# Patient Record
Sex: Female | Born: 1940
Health system: Southern US, Community
[De-identification: ages and names within clinical notes are randomized; demographics above are authoritative.]

## PROBLEM LIST (undated history)

## (undated) DIAGNOSIS — M2550 Pain in unspecified joint: Secondary | ICD-10-CM

## (undated) DIAGNOSIS — K5900 Constipation, unspecified: Secondary | ICD-10-CM

## (undated) DIAGNOSIS — M5136 Other intervertebral disc degeneration, lumbar region: Secondary | ICD-10-CM

## (undated) DIAGNOSIS — Z86718 Personal history of other venous thrombosis and embolism: Secondary | ICD-10-CM

## (undated) DIAGNOSIS — G894 Chronic pain syndrome: Secondary | ICD-10-CM

## (undated) DIAGNOSIS — M329 Systemic lupus erythematosus, unspecified: Secondary | ICD-10-CM

## (undated) DIAGNOSIS — M069 Rheumatoid arthritis, unspecified: Secondary | ICD-10-CM

## (undated) DIAGNOSIS — E079 Disorder of thyroid, unspecified: Secondary | ICD-10-CM

## (undated) DIAGNOSIS — M1990 Unspecified osteoarthritis, unspecified site: Secondary | ICD-10-CM

## (undated) DIAGNOSIS — R0602 Shortness of breath: Secondary | ICD-10-CM

## (undated) DIAGNOSIS — J449 Chronic obstructive pulmonary disease, unspecified: Secondary | ICD-10-CM

## (undated) DIAGNOSIS — E559 Vitamin D deficiency, unspecified: Secondary | ICD-10-CM

## (undated) DIAGNOSIS — N390 Urinary tract infection, site not specified: Secondary | ICD-10-CM

## (undated) DIAGNOSIS — K219 Gastro-esophageal reflux disease without esophagitis: Secondary | ICD-10-CM

## (undated) DIAGNOSIS — M797 Fibromyalgia: Secondary | ICD-10-CM

## (undated) DIAGNOSIS — IMO0002 Reserved for concepts with insufficient information to code with codable children: Secondary | ICD-10-CM

## (undated) DIAGNOSIS — E785 Hyperlipidemia, unspecified: Secondary | ICD-10-CM

## (undated) DIAGNOSIS — M51369 Other intervertebral disc degeneration, lumbar region without mention of lumbar back pain or lower extremity pain: Secondary | ICD-10-CM

## (undated) DIAGNOSIS — E1140 Type 2 diabetes mellitus with diabetic neuropathy, unspecified: Secondary | ICD-10-CM

## (undated) DIAGNOSIS — I10 Essential (primary) hypertension: Secondary | ICD-10-CM

## (undated) HISTORY — DX: Pain in unspecified joint: M25.50

## (undated) HISTORY — DX: Other intervertebral disc degeneration, lumbar region without mention of lumbar back pain or lower extremity pain: M51.369

## (undated) HISTORY — PX: REPLACEMENT TOTAL KNEE: SUR1224

## (undated) HISTORY — PX: OTHER SURGICAL HISTORY: SHX169

## (undated) HISTORY — DX: Fibromyalgia: M79.7

## (undated) HISTORY — DX: Disorder of thyroid, unspecified: E07.9

## (undated) HISTORY — DX: Shortness of breath: R06.02

## (undated) HISTORY — DX: Type 2 diabetes mellitus with diabetic neuropathy, unspecified: E11.40

## (undated) HISTORY — DX: Chronic obstructive pulmonary disease, unspecified: J44.9

## (undated) HISTORY — DX: Chronic pain syndrome: G89.4

## (undated) HISTORY — DX: Gastro-esophageal reflux disease without esophagitis: K21.9

## (undated) HISTORY — DX: Rheumatoid arthritis, unspecified: M06.9

## (undated) HISTORY — DX: Unspecified osteoarthritis, unspecified site: M19.90

## (undated) HISTORY — DX: Vitamin D deficiency, unspecified: E55.9

## (undated) HISTORY — PX: ABDOMINAL HYSTERECTOMY: SHX81

## (undated) HISTORY — DX: Essential (primary) hypertension: I10

## (undated) HISTORY — DX: Reserved for concepts with insufficient information to code with codable children: IMO0002

## (undated) HISTORY — DX: Hyperlipidemia, unspecified: E78.5

## (undated) HISTORY — DX: Systemic lupus erythematosus, unspecified: M32.9

## (undated) HISTORY — PX: PARTIAL HYSTERECTOMY: SHX80

## (undated) HISTORY — PX: BACK SURGERY: SHX140

## (undated) HISTORY — DX: Personal history of other venous thrombosis and embolism: Z86.718

## (undated) HISTORY — DX: Constipation, unspecified: K59.00

## (undated) HISTORY — DX: Urinary tract infection, site not specified: N39.0

## (undated) HISTORY — DX: Other intervertebral disc degeneration, lumbar region: M51.36

---

## 2016-09-27 DIAGNOSIS — J449 Chronic obstructive pulmonary disease, unspecified: Secondary | ICD-10-CM | POA: Diagnosis not present

## 2016-09-27 DIAGNOSIS — M329 Systemic lupus erythematosus, unspecified: Secondary | ICD-10-CM | POA: Diagnosis not present

## 2016-09-27 DIAGNOSIS — I10 Essential (primary) hypertension: Secondary | ICD-10-CM | POA: Diagnosis not present

## 2016-09-27 DIAGNOSIS — E1142 Type 2 diabetes mellitus with diabetic polyneuropathy: Secondary | ICD-10-CM | POA: Diagnosis not present

## 2016-09-27 DIAGNOSIS — M797 Fibromyalgia: Secondary | ICD-10-CM | POA: Diagnosis not present

## 2016-09-27 DIAGNOSIS — E039 Hypothyroidism, unspecified: Secondary | ICD-10-CM | POA: Diagnosis not present

## 2016-09-27 DIAGNOSIS — E785 Hyperlipidemia, unspecified: Secondary | ICD-10-CM | POA: Diagnosis not present

## 2016-09-27 DIAGNOSIS — M810 Age-related osteoporosis without current pathological fracture: Secondary | ICD-10-CM | POA: Diagnosis not present

## 2016-10-12 DIAGNOSIS — Z87440 Personal history of urinary (tract) infections: Secondary | ICD-10-CM | POA: Diagnosis not present

## 2016-10-12 DIAGNOSIS — M797 Fibromyalgia: Secondary | ICD-10-CM | POA: Diagnosis not present

## 2016-10-12 DIAGNOSIS — M329 Systemic lupus erythematosus, unspecified: Secondary | ICD-10-CM | POA: Diagnosis not present

## 2016-10-12 DIAGNOSIS — N390 Urinary tract infection, site not specified: Secondary | ICD-10-CM | POA: Diagnosis not present

## 2016-10-12 DIAGNOSIS — M25473 Effusion, unspecified ankle: Secondary | ICD-10-CM | POA: Diagnosis not present

## 2016-10-12 DIAGNOSIS — E1142 Type 2 diabetes mellitus with diabetic polyneuropathy: Secondary | ICD-10-CM | POA: Diagnosis not present

## 2016-11-05 DIAGNOSIS — W19XXXA Unspecified fall, initial encounter: Secondary | ICD-10-CM | POA: Diagnosis not present

## 2016-11-05 DIAGNOSIS — I10 Essential (primary) hypertension: Secondary | ICD-10-CM | POA: Diagnosis not present

## 2016-11-05 DIAGNOSIS — T148XXA Other injury of unspecified body region, initial encounter: Secondary | ICD-10-CM | POA: Diagnosis not present

## 2016-11-05 DIAGNOSIS — M25511 Pain in right shoulder: Secondary | ICD-10-CM | POA: Diagnosis not present

## 2016-11-05 DIAGNOSIS — S42031A Displaced fracture of lateral end of right clavicle, initial encounter for closed fracture: Secondary | ICD-10-CM | POA: Diagnosis not present

## 2016-11-05 DIAGNOSIS — M898X1 Other specified disorders of bone, shoulder: Secondary | ICD-10-CM | POA: Diagnosis not present

## 2016-11-05 DIAGNOSIS — S42001A Fracture of unspecified part of right clavicle, initial encounter for closed fracture: Secondary | ICD-10-CM | POA: Diagnosis not present

## 2016-11-06 DIAGNOSIS — E1140 Type 2 diabetes mellitus with diabetic neuropathy, unspecified: Secondary | ICD-10-CM | POA: Diagnosis not present

## 2016-11-06 DIAGNOSIS — G894 Chronic pain syndrome: Secondary | ICD-10-CM | POA: Diagnosis not present

## 2016-11-06 DIAGNOSIS — M797 Fibromyalgia: Secondary | ICD-10-CM | POA: Diagnosis not present

## 2016-11-06 DIAGNOSIS — E039 Hypothyroidism, unspecified: Secondary | ICD-10-CM | POA: Diagnosis not present

## 2016-11-06 DIAGNOSIS — J449 Chronic obstructive pulmonary disease, unspecified: Secondary | ICD-10-CM | POA: Diagnosis not present

## 2016-11-06 DIAGNOSIS — F329 Major depressive disorder, single episode, unspecified: Secondary | ICD-10-CM | POA: Diagnosis not present

## 2016-11-06 DIAGNOSIS — I10 Essential (primary) hypertension: Secondary | ICD-10-CM | POA: Diagnosis not present

## 2016-11-06 DIAGNOSIS — M329 Systemic lupus erythematosus, unspecified: Secondary | ICD-10-CM | POA: Diagnosis not present

## 2016-11-06 DIAGNOSIS — W19XXXD Unspecified fall, subsequent encounter: Secondary | ICD-10-CM | POA: Diagnosis not present

## 2016-11-06 DIAGNOSIS — M810 Age-related osteoporosis without current pathological fracture: Secondary | ICD-10-CM | POA: Diagnosis not present

## 2016-11-06 DIAGNOSIS — M519 Unspecified thoracic, thoracolumbar and lumbosacral intervertebral disc disorder: Secondary | ICD-10-CM | POA: Diagnosis not present

## 2016-11-06 DIAGNOSIS — Z9181 History of falling: Secondary | ICD-10-CM | POA: Diagnosis not present

## 2016-11-06 DIAGNOSIS — M25511 Pain in right shoulder: Secondary | ICD-10-CM | POA: Diagnosis not present

## 2016-11-06 DIAGNOSIS — S1083XD Contusion of other specified part of neck, subsequent encounter: Secondary | ICD-10-CM | POA: Diagnosis not present

## 2016-11-26 DIAGNOSIS — S42011D Anterior displaced fracture of sternal end of right clavicle, subsequent encounter for fracture with routine healing: Secondary | ICD-10-CM | POA: Diagnosis not present

## 2016-11-30 DIAGNOSIS — M150 Primary generalized (osteo)arthritis: Secondary | ICD-10-CM | POA: Diagnosis not present

## 2016-11-30 DIAGNOSIS — Z6830 Body mass index (BMI) 30.0-30.9, adult: Secondary | ICD-10-CM | POA: Diagnosis not present

## 2016-11-30 DIAGNOSIS — M797 Fibromyalgia: Secondary | ICD-10-CM | POA: Diagnosis not present

## 2016-11-30 DIAGNOSIS — E669 Obesity, unspecified: Secondary | ICD-10-CM | POA: Diagnosis not present

## 2016-11-30 DIAGNOSIS — M329 Systemic lupus erythematosus, unspecified: Secondary | ICD-10-CM | POA: Diagnosis not present

## 2016-11-30 DIAGNOSIS — M7989 Other specified soft tissue disorders: Secondary | ICD-10-CM | POA: Diagnosis not present

## 2016-11-30 DIAGNOSIS — M5136 Other intervertebral disc degeneration, lumbar region: Secondary | ICD-10-CM | POA: Diagnosis not present

## 2016-12-13 DIAGNOSIS — M542 Cervicalgia: Secondary | ICD-10-CM | POA: Diagnosis not present

## 2016-12-13 DIAGNOSIS — E039 Hypothyroidism, unspecified: Secondary | ICD-10-CM | POA: Diagnosis not present

## 2016-12-13 DIAGNOSIS — M549 Dorsalgia, unspecified: Secondary | ICD-10-CM | POA: Diagnosis not present

## 2016-12-13 DIAGNOSIS — I10 Essential (primary) hypertension: Secondary | ICD-10-CM | POA: Diagnosis not present

## 2016-12-13 DIAGNOSIS — M329 Systemic lupus erythematosus, unspecified: Secondary | ICD-10-CM | POA: Diagnosis not present

## 2016-12-13 DIAGNOSIS — J449 Chronic obstructive pulmonary disease, unspecified: Secondary | ICD-10-CM | POA: Diagnosis not present

## 2016-12-13 DIAGNOSIS — M797 Fibromyalgia: Secondary | ICD-10-CM | POA: Diagnosis not present

## 2016-12-13 DIAGNOSIS — E1142 Type 2 diabetes mellitus with diabetic polyneuropathy: Secondary | ICD-10-CM | POA: Diagnosis not present

## 2016-12-13 DIAGNOSIS — E785 Hyperlipidemia, unspecified: Secondary | ICD-10-CM | POA: Diagnosis not present

## 2016-12-22 DIAGNOSIS — M5136 Other intervertebral disc degeneration, lumbar region: Secondary | ICD-10-CM | POA: Diagnosis not present

## 2016-12-22 DIAGNOSIS — I700 Atherosclerosis of aorta: Secondary | ICD-10-CM | POA: Diagnosis not present

## 2016-12-22 DIAGNOSIS — M542 Cervicalgia: Secondary | ICD-10-CM | POA: Diagnosis not present

## 2016-12-22 DIAGNOSIS — M549 Dorsalgia, unspecified: Secondary | ICD-10-CM | POA: Diagnosis not present

## 2016-12-22 DIAGNOSIS — S22050A Wedge compression fracture of T5-T6 vertebra, initial encounter for closed fracture: Secondary | ICD-10-CM | POA: Diagnosis not present

## 2016-12-22 DIAGNOSIS — M4854XA Collapsed vertebra, not elsewhere classified, thoracic region, initial encounter for fracture: Secondary | ICD-10-CM | POA: Diagnosis not present

## 2017-01-18 DIAGNOSIS — L578 Other skin changes due to chronic exposure to nonionizing radiation: Secondary | ICD-10-CM | POA: Diagnosis not present

## 2017-01-18 DIAGNOSIS — L821 Other seborrheic keratosis: Secondary | ICD-10-CM | POA: Diagnosis not present

## 2017-01-18 DIAGNOSIS — Z85820 Personal history of malignant melanoma of skin: Secondary | ICD-10-CM | POA: Diagnosis not present

## 2017-01-18 DIAGNOSIS — L570 Actinic keratosis: Secondary | ICD-10-CM | POA: Diagnosis not present

## 2017-01-18 DIAGNOSIS — D485 Neoplasm of uncertain behavior of skin: Secondary | ICD-10-CM | POA: Diagnosis not present

## 2017-02-15 DIAGNOSIS — R937 Abnormal findings on diagnostic imaging of other parts of musculoskeletal system: Secondary | ICD-10-CM | POA: Diagnosis not present

## 2017-02-15 DIAGNOSIS — M4802 Spinal stenosis, cervical region: Secondary | ICD-10-CM | POA: Diagnosis not present

## 2017-02-15 DIAGNOSIS — M5030 Other cervical disc degeneration, unspecified cervical region: Secondary | ICD-10-CM | POA: Diagnosis not present

## 2017-02-15 DIAGNOSIS — M542 Cervicalgia: Secondary | ICD-10-CM | POA: Diagnosis not present

## 2017-03-01 DIAGNOSIS — M47812 Spondylosis without myelopathy or radiculopathy, cervical region: Secondary | ICD-10-CM | POA: Diagnosis not present

## 2017-03-22 DIAGNOSIS — I10 Essential (primary) hypertension: Secondary | ICD-10-CM | POA: Diagnosis not present

## 2017-03-22 DIAGNOSIS — Z79899 Other long term (current) drug therapy: Secondary | ICD-10-CM | POA: Diagnosis not present

## 2017-03-22 DIAGNOSIS — E1142 Type 2 diabetes mellitus with diabetic polyneuropathy: Secondary | ICD-10-CM | POA: Diagnosis not present

## 2017-03-22 DIAGNOSIS — E039 Hypothyroidism, unspecified: Secondary | ICD-10-CM | POA: Diagnosis not present

## 2017-03-22 DIAGNOSIS — E559 Vitamin D deficiency, unspecified: Secondary | ICD-10-CM | POA: Diagnosis not present

## 2017-03-22 DIAGNOSIS — M47812 Spondylosis without myelopathy or radiculopathy, cervical region: Secondary | ICD-10-CM | POA: Diagnosis not present

## 2017-03-22 DIAGNOSIS — E785 Hyperlipidemia, unspecified: Secondary | ICD-10-CM | POA: Diagnosis not present

## 2017-03-30 DIAGNOSIS — M4802 Spinal stenosis, cervical region: Secondary | ICD-10-CM | POA: Diagnosis not present

## 2017-05-05 DIAGNOSIS — M545 Low back pain: Secondary | ICD-10-CM | POA: Diagnosis not present

## 2017-05-05 DIAGNOSIS — S3992XA Unspecified injury of lower back, initial encounter: Secondary | ICD-10-CM | POA: Diagnosis not present

## 2017-05-05 DIAGNOSIS — W57XXXA Bitten or stung by nonvenomous insect and other nonvenomous arthropods, initial encounter: Secondary | ICD-10-CM | POA: Diagnosis not present

## 2017-05-24 DIAGNOSIS — M5442 Lumbago with sciatica, left side: Secondary | ICD-10-CM | POA: Diagnosis not present

## 2017-05-24 DIAGNOSIS — M542 Cervicalgia: Secondary | ICD-10-CM | POA: Diagnosis not present

## 2017-05-24 DIAGNOSIS — G8929 Other chronic pain: Secondary | ICD-10-CM | POA: Diagnosis not present

## 2017-05-24 DIAGNOSIS — M62830 Muscle spasm of back: Secondary | ICD-10-CM | POA: Diagnosis not present

## 2017-05-24 DIAGNOSIS — M5441 Lumbago with sciatica, right side: Secondary | ICD-10-CM | POA: Diagnosis not present

## 2017-05-24 DIAGNOSIS — Z79891 Long term (current) use of opiate analgesic: Secondary | ICD-10-CM | POA: Diagnosis not present

## 2017-05-24 DIAGNOSIS — G629 Polyneuropathy, unspecified: Secondary | ICD-10-CM | POA: Diagnosis not present

## 2017-06-14 DIAGNOSIS — M1612 Unilateral primary osteoarthritis, left hip: Secondary | ICD-10-CM | POA: Diagnosis not present

## 2017-06-14 DIAGNOSIS — M5416 Radiculopathy, lumbar region: Secondary | ICD-10-CM | POA: Diagnosis not present

## 2017-06-14 DIAGNOSIS — M1712 Unilateral primary osteoarthritis, left knee: Secondary | ICD-10-CM | POA: Diagnosis not present

## 2017-06-26 DIAGNOSIS — M810 Age-related osteoporosis without current pathological fracture: Secondary | ICD-10-CM | POA: Diagnosis not present

## 2017-06-26 DIAGNOSIS — F329 Major depressive disorder, single episode, unspecified: Secondary | ICD-10-CM | POA: Diagnosis not present

## 2017-06-26 DIAGNOSIS — Z7984 Long term (current) use of oral hypoglycemic drugs: Secondary | ICD-10-CM | POA: Diagnosis not present

## 2017-06-26 DIAGNOSIS — M797 Fibromyalgia: Secondary | ICD-10-CM | POA: Diagnosis not present

## 2017-06-26 DIAGNOSIS — J449 Chronic obstructive pulmonary disease, unspecified: Secondary | ICD-10-CM | POA: Diagnosis not present

## 2017-06-26 DIAGNOSIS — Z9181 History of falling: Secondary | ICD-10-CM | POA: Diagnosis not present

## 2017-06-26 DIAGNOSIS — I10 Essential (primary) hypertension: Secondary | ICD-10-CM | POA: Diagnosis not present

## 2017-06-26 DIAGNOSIS — M1712 Unilateral primary osteoarthritis, left knee: Secondary | ICD-10-CM | POA: Diagnosis not present

## 2017-06-26 DIAGNOSIS — E039 Hypothyroidism, unspecified: Secondary | ICD-10-CM | POA: Diagnosis not present

## 2017-06-26 DIAGNOSIS — M069 Rheumatoid arthritis, unspecified: Secondary | ICD-10-CM | POA: Diagnosis not present

## 2017-06-26 DIAGNOSIS — E1140 Type 2 diabetes mellitus with diabetic neuropathy, unspecified: Secondary | ICD-10-CM | POA: Diagnosis not present

## 2017-06-26 DIAGNOSIS — M1612 Unilateral primary osteoarthritis, left hip: Secondary | ICD-10-CM | POA: Diagnosis not present

## 2017-06-26 DIAGNOSIS — M329 Systemic lupus erythematosus, unspecified: Secondary | ICD-10-CM | POA: Diagnosis not present

## 2017-06-26 DIAGNOSIS — M5416 Radiculopathy, lumbar region: Secondary | ICD-10-CM | POA: Diagnosis not present

## 2017-06-26 DIAGNOSIS — Z7982 Long term (current) use of aspirin: Secondary | ICD-10-CM | POA: Diagnosis not present

## 2017-06-26 DIAGNOSIS — G894 Chronic pain syndrome: Secondary | ICD-10-CM | POA: Diagnosis not present

## 2017-06-26 DIAGNOSIS — M519 Unspecified thoracic, thoracolumbar and lumbosacral intervertebral disc disorder: Secondary | ICD-10-CM | POA: Diagnosis not present

## 2017-06-28 DIAGNOSIS — M25552 Pain in left hip: Secondary | ICD-10-CM | POA: Diagnosis not present

## 2017-06-28 DIAGNOSIS — M5136 Other intervertebral disc degeneration, lumbar region: Secondary | ICD-10-CM | POA: Diagnosis not present

## 2017-06-28 DIAGNOSIS — M542 Cervicalgia: Secondary | ICD-10-CM | POA: Diagnosis not present

## 2017-06-28 DIAGNOSIS — Z79891 Long term (current) use of opiate analgesic: Secondary | ICD-10-CM | POA: Diagnosis not present

## 2017-06-28 DIAGNOSIS — G8929 Other chronic pain: Secondary | ICD-10-CM | POA: Diagnosis not present

## 2017-06-28 DIAGNOSIS — M25562 Pain in left knee: Secondary | ICD-10-CM | POA: Diagnosis not present

## 2017-06-28 DIAGNOSIS — G629 Polyneuropathy, unspecified: Secondary | ICD-10-CM | POA: Diagnosis not present

## 2017-07-08 DIAGNOSIS — M1712 Unilateral primary osteoarthritis, left knee: Secondary | ICD-10-CM | POA: Diagnosis not present

## 2017-07-08 DIAGNOSIS — M1612 Unilateral primary osteoarthritis, left hip: Secondary | ICD-10-CM | POA: Diagnosis not present

## 2017-07-09 DIAGNOSIS — M1712 Unilateral primary osteoarthritis, left knee: Secondary | ICD-10-CM | POA: Diagnosis not present

## 2017-07-14 DIAGNOSIS — M1612 Unilateral primary osteoarthritis, left hip: Secondary | ICD-10-CM | POA: Diagnosis not present

## 2017-07-14 DIAGNOSIS — M25552 Pain in left hip: Secondary | ICD-10-CM | POA: Diagnosis not present

## 2017-07-26 DIAGNOSIS — E039 Hypothyroidism, unspecified: Secondary | ICD-10-CM | POA: Diagnosis not present

## 2017-07-26 DIAGNOSIS — M150 Primary generalized (osteo)arthritis: Secondary | ICD-10-CM | POA: Diagnosis not present

## 2017-07-26 DIAGNOSIS — I10 Essential (primary) hypertension: Secondary | ICD-10-CM | POA: Diagnosis not present

## 2017-07-26 DIAGNOSIS — Z79899 Other long term (current) drug therapy: Secondary | ICD-10-CM | POA: Diagnosis not present

## 2017-07-26 DIAGNOSIS — E119 Type 2 diabetes mellitus without complications: Secondary | ICD-10-CM | POA: Diagnosis not present

## 2017-07-26 DIAGNOSIS — K219 Gastro-esophageal reflux disease without esophagitis: Secondary | ICD-10-CM | POA: Diagnosis not present

## 2017-07-26 DIAGNOSIS — E785 Hyperlipidemia, unspecified: Secondary | ICD-10-CM | POA: Diagnosis not present

## 2017-07-26 DIAGNOSIS — E1142 Type 2 diabetes mellitus with diabetic polyneuropathy: Secondary | ICD-10-CM | POA: Diagnosis not present

## 2017-07-29 DIAGNOSIS — M25562 Pain in left knee: Secondary | ICD-10-CM | POA: Diagnosis not present

## 2017-07-29 DIAGNOSIS — M25552 Pain in left hip: Secondary | ICD-10-CM | POA: Diagnosis not present

## 2017-08-02 DIAGNOSIS — Z981 Arthrodesis status: Secondary | ICD-10-CM | POA: Diagnosis not present

## 2017-08-02 DIAGNOSIS — G629 Polyneuropathy, unspecified: Secondary | ICD-10-CM | POA: Diagnosis not present

## 2017-08-02 DIAGNOSIS — M5136 Other intervertebral disc degeneration, lumbar region: Secondary | ICD-10-CM | POA: Diagnosis not present

## 2017-08-02 DIAGNOSIS — M5441 Lumbago with sciatica, right side: Secondary | ICD-10-CM | POA: Diagnosis not present

## 2017-08-02 DIAGNOSIS — M47812 Spondylosis without myelopathy or radiculopathy, cervical region: Secondary | ICD-10-CM | POA: Diagnosis not present

## 2017-08-02 DIAGNOSIS — M4690 Unspecified inflammatory spondylopathy, site unspecified: Secondary | ICD-10-CM | POA: Diagnosis not present

## 2017-08-02 DIAGNOSIS — Z79891 Long term (current) use of opiate analgesic: Secondary | ICD-10-CM | POA: Diagnosis not present

## 2017-08-02 DIAGNOSIS — M542 Cervicalgia: Secondary | ICD-10-CM | POA: Diagnosis not present

## 2017-08-02 DIAGNOSIS — M5030 Other cervical disc degeneration, unspecified cervical region: Secondary | ICD-10-CM | POA: Diagnosis not present

## 2017-08-02 DIAGNOSIS — G8929 Other chronic pain: Secondary | ICD-10-CM | POA: Diagnosis not present

## 2017-08-02 DIAGNOSIS — M5442 Lumbago with sciatica, left side: Secondary | ICD-10-CM | POA: Diagnosis not present

## 2017-08-02 DIAGNOSIS — T402X5D Adverse effect of other opioids, subsequent encounter: Secondary | ICD-10-CM | POA: Diagnosis not present

## 2017-08-02 DIAGNOSIS — T402X5A Adverse effect of other opioids, initial encounter: Secondary | ICD-10-CM | POA: Diagnosis not present

## 2017-08-02 DIAGNOSIS — K5903 Drug induced constipation: Secondary | ICD-10-CM | POA: Diagnosis not present

## 2017-10-25 DIAGNOSIS — M47816 Spondylosis without myelopathy or radiculopathy, lumbar region: Secondary | ICD-10-CM | POA: Diagnosis not present

## 2017-10-25 DIAGNOSIS — Z79891 Long term (current) use of opiate analgesic: Secondary | ICD-10-CM | POA: Diagnosis not present

## 2017-10-25 DIAGNOSIS — G8929 Other chronic pain: Secondary | ICD-10-CM | POA: Diagnosis not present

## 2017-10-25 DIAGNOSIS — T402X5A Adverse effect of other opioids, initial encounter: Secondary | ICD-10-CM | POA: Diagnosis not present

## 2017-10-25 DIAGNOSIS — M542 Cervicalgia: Secondary | ICD-10-CM | POA: Diagnosis not present

## 2017-10-25 DIAGNOSIS — K5903 Drug induced constipation: Secondary | ICD-10-CM | POA: Diagnosis not present

## 2017-10-25 DIAGNOSIS — G629 Polyneuropathy, unspecified: Secondary | ICD-10-CM | POA: Diagnosis not present

## 2017-10-25 DIAGNOSIS — M5136 Other intervertebral disc degeneration, lumbar region: Secondary | ICD-10-CM | POA: Diagnosis not present

## 2017-11-29 DIAGNOSIS — E1142 Type 2 diabetes mellitus with diabetic polyneuropathy: Secondary | ICD-10-CM | POA: Diagnosis not present

## 2017-11-29 DIAGNOSIS — M545 Low back pain: Secondary | ICD-10-CM | POA: Diagnosis not present

## 2017-11-29 DIAGNOSIS — E785 Hyperlipidemia, unspecified: Secondary | ICD-10-CM | POA: Diagnosis not present

## 2017-11-29 DIAGNOSIS — I10 Essential (primary) hypertension: Secondary | ICD-10-CM | POA: Diagnosis not present

## 2017-11-29 DIAGNOSIS — K219 Gastro-esophageal reflux disease without esophagitis: Secondary | ICD-10-CM | POA: Diagnosis not present

## 2017-11-29 DIAGNOSIS — E039 Hypothyroidism, unspecified: Secondary | ICD-10-CM | POA: Diagnosis not present

## 2017-12-06 DIAGNOSIS — M5441 Lumbago with sciatica, right side: Secondary | ICD-10-CM | POA: Diagnosis not present

## 2017-12-06 DIAGNOSIS — K5903 Drug induced constipation: Secondary | ICD-10-CM | POA: Diagnosis not present

## 2017-12-06 DIAGNOSIS — M5136 Other intervertebral disc degeneration, lumbar region: Secondary | ICD-10-CM | POA: Diagnosis not present

## 2017-12-06 DIAGNOSIS — M5442 Lumbago with sciatica, left side: Secondary | ICD-10-CM | POA: Diagnosis not present

## 2017-12-06 DIAGNOSIS — M47819 Spondylosis without myelopathy or radiculopathy, site unspecified: Secondary | ICD-10-CM | POA: Diagnosis not present

## 2017-12-06 DIAGNOSIS — M1990 Unspecified osteoarthritis, unspecified site: Secondary | ICD-10-CM | POA: Diagnosis not present

## 2017-12-06 DIAGNOSIS — G629 Polyneuropathy, unspecified: Secondary | ICD-10-CM | POA: Diagnosis not present

## 2017-12-06 DIAGNOSIS — T402X5A Adverse effect of other opioids, initial encounter: Secondary | ICD-10-CM | POA: Diagnosis not present

## 2017-12-06 DIAGNOSIS — M5030 Other cervical disc degeneration, unspecified cervical region: Secondary | ICD-10-CM | POA: Diagnosis not present

## 2017-12-06 DIAGNOSIS — M542 Cervicalgia: Secondary | ICD-10-CM | POA: Diagnosis not present

## 2018-01-20 DIAGNOSIS — R69 Illness, unspecified: Secondary | ICD-10-CM | POA: Diagnosis not present

## 2018-01-24 DIAGNOSIS — Z79899 Other long term (current) drug therapy: Secondary | ICD-10-CM | POA: Diagnosis not present

## 2018-02-15 DIAGNOSIS — R233 Spontaneous ecchymoses: Secondary | ICD-10-CM | POA: Diagnosis not present

## 2018-02-15 DIAGNOSIS — L578 Other skin changes due to chronic exposure to nonionizing radiation: Secondary | ICD-10-CM | POA: Diagnosis not present

## 2018-02-15 DIAGNOSIS — C44622 Squamous cell carcinoma of skin of right upper limb, including shoulder: Secondary | ICD-10-CM | POA: Diagnosis not present

## 2018-02-28 DIAGNOSIS — G8929 Other chronic pain: Secondary | ICD-10-CM

## 2018-02-28 DIAGNOSIS — K5903 Drug induced constipation: Secondary | ICD-10-CM

## 2018-02-28 DIAGNOSIS — M62830 Muscle spasm of back: Secondary | ICD-10-CM

## 2018-02-28 DIAGNOSIS — M5030 Other cervical disc degeneration, unspecified cervical region: Secondary | ICD-10-CM

## 2018-02-28 DIAGNOSIS — M1990 Unspecified osteoarthritis, unspecified site: Secondary | ICD-10-CM | POA: Insufficient documentation

## 2018-02-28 DIAGNOSIS — M5136 Other intervertebral disc degeneration, lumbar region: Secondary | ICD-10-CM

## 2018-02-28 DIAGNOSIS — M542 Cervicalgia: Secondary | ICD-10-CM

## 2018-02-28 DIAGNOSIS — M4726 Other spondylosis with radiculopathy, lumbar region: Secondary | ICD-10-CM | POA: Diagnosis not present

## 2018-02-28 DIAGNOSIS — M5442 Lumbago with sciatica, left side: Secondary | ICD-10-CM | POA: Insufficient documentation

## 2018-02-28 DIAGNOSIS — Z981 Arthrodesis status: Secondary | ICD-10-CM | POA: Insufficient documentation

## 2018-02-28 DIAGNOSIS — Z79891 Long term (current) use of opiate analgesic: Secondary | ICD-10-CM | POA: Diagnosis not present

## 2018-02-28 DIAGNOSIS — M51369 Other intervertebral disc degeneration, lumbar region without mention of lumbar back pain or lower extremity pain: Secondary | ICD-10-CM

## 2018-02-28 DIAGNOSIS — M47819 Spondylosis without myelopathy or radiculopathy, site unspecified: Secondary | ICD-10-CM

## 2018-02-28 DIAGNOSIS — G629 Polyneuropathy, unspecified: Secondary | ICD-10-CM | POA: Insufficient documentation

## 2018-02-28 DIAGNOSIS — T402X5A Adverse effect of other opioids, initial encounter: Secondary | ICD-10-CM | POA: Insufficient documentation

## 2018-02-28 HISTORY — DX: Cervicalgia: M54.2

## 2018-02-28 HISTORY — DX: Other chronic pain: G89.29

## 2018-02-28 HISTORY — DX: Spondylosis without myelopathy or radiculopathy, site unspecified: M47.819

## 2018-02-28 HISTORY — DX: Adverse effect of other opioids, initial encounter: T40.2X5A

## 2018-02-28 HISTORY — DX: Polyneuropathy, unspecified: G62.9

## 2018-02-28 HISTORY — DX: Other intervertebral disc degeneration, lumbar region without mention of lumbar back pain or lower extremity pain: M51.369

## 2018-02-28 HISTORY — DX: Arthrodesis status: Z98.1

## 2018-02-28 HISTORY — DX: Drug induced constipation: K59.03

## 2018-02-28 HISTORY — DX: Muscle spasm of back: M62.830

## 2018-02-28 HISTORY — DX: Other cervical disc degeneration, unspecified cervical region: M50.30

## 2018-02-28 HISTORY — DX: Lumbago with sciatica, left side: M54.42

## 2018-02-28 HISTORY — DX: Other intervertebral disc degeneration, lumbar region: M51.36

## 2018-03-02 DIAGNOSIS — M797 Fibromyalgia: Secondary | ICD-10-CM | POA: Diagnosis not present

## 2018-03-02 DIAGNOSIS — E1142 Type 2 diabetes mellitus with diabetic polyneuropathy: Secondary | ICD-10-CM | POA: Diagnosis not present

## 2018-03-02 DIAGNOSIS — J301 Allergic rhinitis due to pollen: Secondary | ICD-10-CM | POA: Diagnosis not present

## 2018-03-02 DIAGNOSIS — E785 Hyperlipidemia, unspecified: Secondary | ICD-10-CM | POA: Diagnosis not present

## 2018-03-02 DIAGNOSIS — Z79899 Other long term (current) drug therapy: Secondary | ICD-10-CM | POA: Diagnosis not present

## 2018-03-02 DIAGNOSIS — E039 Hypothyroidism, unspecified: Secondary | ICD-10-CM | POA: Diagnosis not present

## 2018-03-02 DIAGNOSIS — I10 Essential (primary) hypertension: Secondary | ICD-10-CM | POA: Diagnosis not present

## 2018-03-16 DIAGNOSIS — M25562 Pain in left knee: Secondary | ICD-10-CM | POA: Diagnosis not present

## 2018-03-16 DIAGNOSIS — M25569 Pain in unspecified knee: Secondary | ICD-10-CM | POA: Diagnosis not present

## 2018-04-11 DIAGNOSIS — M25562 Pain in left knee: Secondary | ICD-10-CM | POA: Diagnosis not present

## 2018-04-13 DIAGNOSIS — E871 Hypo-osmolality and hyponatremia: Secondary | ICD-10-CM | POA: Diagnosis not present

## 2018-04-21 DIAGNOSIS — J302 Other seasonal allergic rhinitis: Secondary | ICD-10-CM | POA: Diagnosis not present

## 2018-04-21 DIAGNOSIS — I10 Essential (primary) hypertension: Secondary | ICD-10-CM | POA: Diagnosis not present

## 2018-04-21 DIAGNOSIS — E039 Hypothyroidism, unspecified: Secondary | ICD-10-CM | POA: Diagnosis not present

## 2018-04-21 DIAGNOSIS — K219 Gastro-esophageal reflux disease without esophagitis: Secondary | ICD-10-CM | POA: Diagnosis not present

## 2018-04-21 DIAGNOSIS — E785 Hyperlipidemia, unspecified: Secondary | ICD-10-CM | POA: Diagnosis not present

## 2018-04-21 DIAGNOSIS — J449 Chronic obstructive pulmonary disease, unspecified: Secondary | ICD-10-CM | POA: Diagnosis not present

## 2018-04-21 DIAGNOSIS — K5900 Constipation, unspecified: Secondary | ICD-10-CM | POA: Diagnosis not present

## 2018-04-21 DIAGNOSIS — E669 Obesity, unspecified: Secondary | ICD-10-CM | POA: Diagnosis not present

## 2018-04-21 DIAGNOSIS — E1142 Type 2 diabetes mellitus with diabetic polyneuropathy: Secondary | ICD-10-CM | POA: Diagnosis not present

## 2018-04-21 DIAGNOSIS — G8929 Other chronic pain: Secondary | ICD-10-CM | POA: Diagnosis not present

## 2018-05-04 DIAGNOSIS — H43811 Vitreous degeneration, right eye: Secondary | ICD-10-CM | POA: Diagnosis not present

## 2018-05-04 DIAGNOSIS — E119 Type 2 diabetes mellitus without complications: Secondary | ICD-10-CM | POA: Diagnosis not present

## 2018-05-04 DIAGNOSIS — Z961 Presence of intraocular lens: Secondary | ICD-10-CM | POA: Diagnosis not present

## 2018-05-30 DIAGNOSIS — G629 Polyneuropathy, unspecified: Secondary | ICD-10-CM | POA: Diagnosis not present

## 2018-05-30 DIAGNOSIS — G8929 Other chronic pain: Secondary | ICD-10-CM | POA: Diagnosis not present

## 2018-05-30 DIAGNOSIS — M5416 Radiculopathy, lumbar region: Secondary | ICD-10-CM | POA: Diagnosis not present

## 2018-05-30 DIAGNOSIS — Z79891 Long term (current) use of opiate analgesic: Secondary | ICD-10-CM | POA: Diagnosis not present

## 2018-05-30 DIAGNOSIS — Z981 Arthrodesis status: Secondary | ICD-10-CM | POA: Diagnosis not present

## 2018-05-30 DIAGNOSIS — M25562 Pain in left knee: Secondary | ICD-10-CM | POA: Diagnosis not present

## 2018-05-30 DIAGNOSIS — M150 Primary generalized (osteo)arthritis: Secondary | ICD-10-CM | POA: Diagnosis not present

## 2018-05-30 DIAGNOSIS — M5136 Other intervertebral disc degeneration, lumbar region: Secondary | ICD-10-CM | POA: Diagnosis not present

## 2018-05-30 DIAGNOSIS — M47812 Spondylosis without myelopathy or radiculopathy, cervical region: Secondary | ICD-10-CM | POA: Diagnosis not present

## 2018-08-18 DIAGNOSIS — R69 Illness, unspecified: Secondary | ICD-10-CM | POA: Diagnosis not present

## 2018-08-21 DIAGNOSIS — S8010XA Contusion of unspecified lower leg, initial encounter: Secondary | ICD-10-CM | POA: Diagnosis not present

## 2018-08-21 DIAGNOSIS — M1712 Unilateral primary osteoarthritis, left knee: Secondary | ICD-10-CM | POA: Diagnosis not present

## 2018-08-29 DIAGNOSIS — M5136 Other intervertebral disc degeneration, lumbar region: Secondary | ICD-10-CM | POA: Diagnosis not present

## 2018-08-29 DIAGNOSIS — G8929 Other chronic pain: Secondary | ICD-10-CM | POA: Diagnosis not present

## 2018-08-29 DIAGNOSIS — M4726 Other spondylosis with radiculopathy, lumbar region: Secondary | ICD-10-CM | POA: Diagnosis not present

## 2018-08-29 DIAGNOSIS — M150 Primary generalized (osteo)arthritis: Secondary | ICD-10-CM | POA: Diagnosis not present

## 2018-08-29 DIAGNOSIS — G629 Polyneuropathy, unspecified: Secondary | ICD-10-CM | POA: Diagnosis not present

## 2018-08-29 DIAGNOSIS — Z79891 Long term (current) use of opiate analgesic: Secondary | ICD-10-CM | POA: Diagnosis not present

## 2018-08-29 DIAGNOSIS — Z981 Arthrodesis status: Secondary | ICD-10-CM | POA: Diagnosis not present

## 2018-08-29 DIAGNOSIS — M25562 Pain in left knee: Secondary | ICD-10-CM | POA: Diagnosis not present

## 2018-08-31 DIAGNOSIS — M329 Systemic lupus erythematosus, unspecified: Secondary | ICD-10-CM | POA: Diagnosis not present

## 2018-08-31 DIAGNOSIS — I10 Essential (primary) hypertension: Secondary | ICD-10-CM | POA: Diagnosis not present

## 2018-08-31 DIAGNOSIS — Z1331 Encounter for screening for depression: Secondary | ICD-10-CM | POA: Diagnosis not present

## 2018-08-31 DIAGNOSIS — Z1339 Encounter for screening examination for other mental health and behavioral disorders: Secondary | ICD-10-CM | POA: Diagnosis not present

## 2018-08-31 DIAGNOSIS — Z6832 Body mass index (BMI) 32.0-32.9, adult: Secondary | ICD-10-CM | POA: Diagnosis not present

## 2018-08-31 DIAGNOSIS — B353 Tinea pedis: Secondary | ICD-10-CM | POA: Diagnosis not present

## 2018-08-31 DIAGNOSIS — Z0001 Encounter for general adult medical examination with abnormal findings: Secondary | ICD-10-CM | POA: Diagnosis not present

## 2018-08-31 DIAGNOSIS — Z23 Encounter for immunization: Secondary | ICD-10-CM | POA: Diagnosis not present

## 2018-08-31 DIAGNOSIS — E1142 Type 2 diabetes mellitus with diabetic polyneuropathy: Secondary | ICD-10-CM | POA: Diagnosis not present

## 2018-08-31 DIAGNOSIS — Z79899 Other long term (current) drug therapy: Secondary | ICD-10-CM | POA: Diagnosis not present

## 2018-09-02 DIAGNOSIS — R69 Illness, unspecified: Secondary | ICD-10-CM | POA: Diagnosis not present

## 2018-09-06 DIAGNOSIS — M79672 Pain in left foot: Secondary | ICD-10-CM | POA: Diagnosis not present

## 2018-09-26 DIAGNOSIS — W19XXXA Unspecified fall, initial encounter: Secondary | ICD-10-CM | POA: Diagnosis not present

## 2018-09-26 DIAGNOSIS — S40011A Contusion of right shoulder, initial encounter: Secondary | ICD-10-CM | POA: Diagnosis not present

## 2018-09-26 DIAGNOSIS — R300 Dysuria: Secondary | ICD-10-CM | POA: Diagnosis not present

## 2018-09-26 DIAGNOSIS — M25511 Pain in right shoulder: Secondary | ICD-10-CM | POA: Diagnosis not present

## 2018-11-07 DIAGNOSIS — M50323 Other cervical disc degeneration at C6-C7 level: Secondary | ICD-10-CM | POA: Diagnosis not present

## 2018-11-07 DIAGNOSIS — M5116 Intervertebral disc disorders with radiculopathy, lumbar region: Secondary | ICD-10-CM | POA: Diagnosis not present

## 2018-11-07 DIAGNOSIS — M50322 Other cervical disc degeneration at C5-C6 level: Secondary | ICD-10-CM | POA: Diagnosis not present

## 2018-11-07 DIAGNOSIS — G8929 Other chronic pain: Secondary | ICD-10-CM | POA: Diagnosis not present

## 2018-11-07 DIAGNOSIS — Z79891 Long term (current) use of opiate analgesic: Secondary | ICD-10-CM | POA: Diagnosis not present

## 2018-11-07 DIAGNOSIS — G629 Polyneuropathy, unspecified: Secondary | ICD-10-CM | POA: Diagnosis not present

## 2018-11-07 DIAGNOSIS — M150 Primary generalized (osteo)arthritis: Secondary | ICD-10-CM | POA: Diagnosis not present

## 2018-11-07 DIAGNOSIS — M5136 Other intervertebral disc degeneration, lumbar region: Secondary | ICD-10-CM | POA: Diagnosis not present

## 2018-12-07 DIAGNOSIS — E1142 Type 2 diabetes mellitus with diabetic polyneuropathy: Secondary | ICD-10-CM | POA: Diagnosis not present

## 2018-12-07 DIAGNOSIS — I10 Essential (primary) hypertension: Secondary | ICD-10-CM | POA: Diagnosis not present

## 2018-12-07 DIAGNOSIS — E039 Hypothyroidism, unspecified: Secondary | ICD-10-CM | POA: Diagnosis not present

## 2018-12-07 DIAGNOSIS — M549 Dorsalgia, unspecified: Secondary | ICD-10-CM | POA: Diagnosis not present

## 2018-12-07 DIAGNOSIS — E785 Hyperlipidemia, unspecified: Secondary | ICD-10-CM | POA: Diagnosis not present

## 2018-12-07 DIAGNOSIS — Z79899 Other long term (current) drug therapy: Secondary | ICD-10-CM | POA: Diagnosis not present

## 2018-12-07 DIAGNOSIS — Z6833 Body mass index (BMI) 33.0-33.9, adult: Secondary | ICD-10-CM | POA: Diagnosis not present

## 2018-12-15 DIAGNOSIS — M545 Low back pain: Secondary | ICD-10-CM | POA: Diagnosis not present

## 2018-12-15 DIAGNOSIS — M5126 Other intervertebral disc displacement, lumbar region: Secondary | ICD-10-CM | POA: Diagnosis not present

## 2018-12-25 DIAGNOSIS — Z6833 Body mass index (BMI) 33.0-33.9, adult: Secondary | ICD-10-CM | POA: Diagnosis not present

## 2018-12-25 DIAGNOSIS — G039 Meningitis, unspecified: Secondary | ICD-10-CM | POA: Diagnosis not present

## 2018-12-25 DIAGNOSIS — M48062 Spinal stenosis, lumbar region with neurogenic claudication: Secondary | ICD-10-CM | POA: Diagnosis not present

## 2019-01-03 ENCOUNTER — Other Ambulatory Visit: Payer: Self-pay | Admitting: Neurological Surgery

## 2019-01-03 DIAGNOSIS — I10 Essential (primary) hypertension: Secondary | ICD-10-CM | POA: Diagnosis not present

## 2019-01-03 DIAGNOSIS — M5416 Radiculopathy, lumbar region: Secondary | ICD-10-CM

## 2019-01-03 DIAGNOSIS — Z6832 Body mass index (BMI) 32.0-32.9, adult: Secondary | ICD-10-CM | POA: Diagnosis not present

## 2019-01-12 DIAGNOSIS — M50322 Other cervical disc degeneration at C5-C6 level: Secondary | ICD-10-CM | POA: Diagnosis not present

## 2019-01-12 DIAGNOSIS — M47819 Spondylosis without myelopathy or radiculopathy, site unspecified: Secondary | ICD-10-CM | POA: Diagnosis not present

## 2019-01-12 DIAGNOSIS — M50323 Other cervical disc degeneration at C6-C7 level: Secondary | ICD-10-CM | POA: Diagnosis not present

## 2019-01-12 DIAGNOSIS — G629 Polyneuropathy, unspecified: Secondary | ICD-10-CM | POA: Diagnosis not present

## 2019-01-12 DIAGNOSIS — Z79891 Long term (current) use of opiate analgesic: Secondary | ICD-10-CM | POA: Diagnosis not present

## 2019-01-12 DIAGNOSIS — E6609 Other obesity due to excess calories: Secondary | ICD-10-CM

## 2019-01-12 DIAGNOSIS — M5136 Other intervertebral disc degeneration, lumbar region: Secondary | ICD-10-CM | POA: Diagnosis not present

## 2019-01-12 DIAGNOSIS — Z6832 Body mass index (BMI) 32.0-32.9, adult: Secondary | ICD-10-CM

## 2019-01-12 DIAGNOSIS — M5116 Intervertebral disc disorders with radiculopathy, lumbar region: Secondary | ICD-10-CM | POA: Diagnosis not present

## 2019-01-12 DIAGNOSIS — Z0289 Encounter for other administrative examinations: Secondary | ICD-10-CM | POA: Insufficient documentation

## 2019-01-12 DIAGNOSIS — G8929 Other chronic pain: Secondary | ICD-10-CM | POA: Diagnosis not present

## 2019-01-12 HISTORY — DX: Other obesity due to excess calories: Z68.32

## 2019-01-12 HISTORY — DX: Encounter for other administrative examinations: Z02.89

## 2019-01-12 HISTORY — DX: Other obesity due to excess calories: E66.09

## 2019-01-15 ENCOUNTER — Ambulatory Visit
Admission: RE | Admit: 2019-01-15 | Discharge: 2019-01-15 | Disposition: A | Discharge status: Home / Self Care | Payer: Self-pay | Source: Ambulatory Visit | Attending: Neurological Surgery | Admitting: Neurological Surgery

## 2019-01-15 DIAGNOSIS — M48061 Spinal stenosis, lumbar region without neurogenic claudication: Secondary | ICD-10-CM | POA: Diagnosis not present

## 2019-01-15 DIAGNOSIS — M5416 Radiculopathy, lumbar region: Secondary | ICD-10-CM

## 2019-01-15 IMAGING — XA DG EPIDURAL NERVE ROOT
2 series · 2 of 2 positions shown · non-contrast
Comparison: none

CLINICAL DATA: Lumbosacral spondylosis without myelopathy. Low back
and left lower extremity pain. Prior L4-5 and L5-S1 fusion. Adjacent
segment disease with severe spinal stenosis at L2-3 and L3-4.
Specific request for a left-sided transforaminal epidural injection
at L3-4.

[Series 1: ortho adipose · 1 of 1 slices shown (1 of 2)]
[im 1/1]
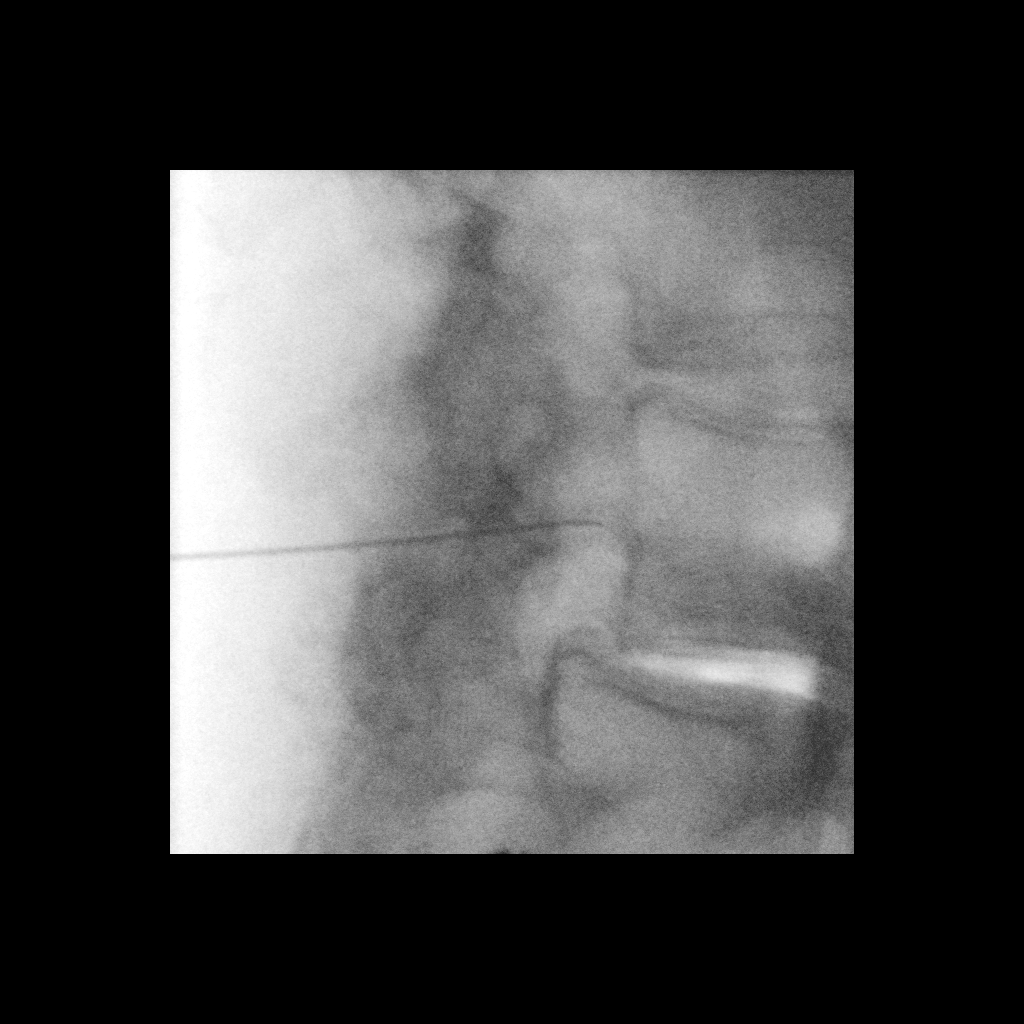

[Series 2: ortho adipose · 1 of 1 slices shown (2 of 2)]
[im 1/1]
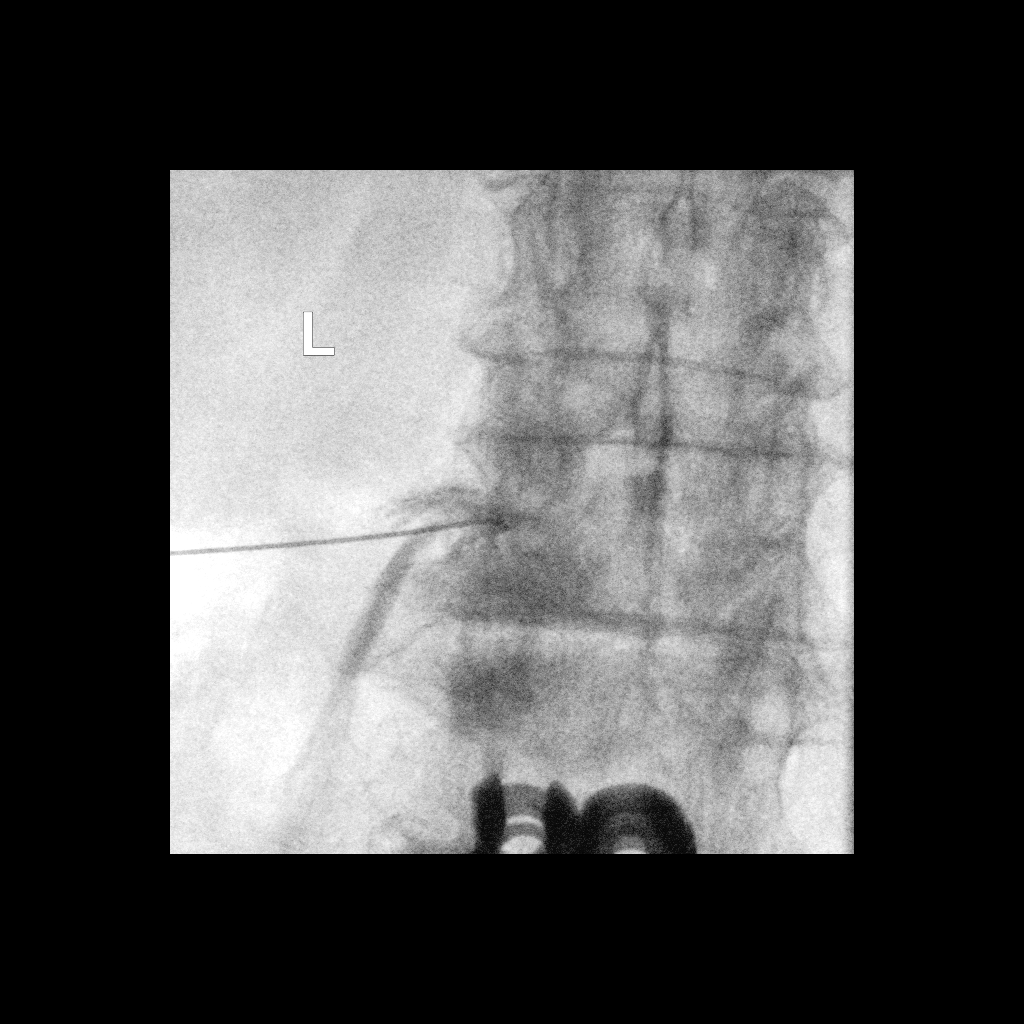

[2 of 2 positions shown; findings below may reference images not displayed]

EXAM:
EPIDURAL/NERVE ROOT

FLUOROSCOPY TIME:  Fluoroscopy Time: 18 seconds

Radiation Exposure Index: 37.09 microGray*m^2

PROCEDURE:
The procedure, risks, benefits, and alternatives were explained to
the patient. Questions regarding the procedure were encouraged and
answered. The patient understands and consents to the procedure.

LEFT L3 NERVE ROOT BLOCK AND TRANSFORAMINAL EPIDURAL: A posterior
oblique approach was taken to the intervertebral foramen on the left
at L3-4 using a curved 3.5 inch 22 gauge spinal needle. Injection of
Isovue-M 200 outlined the left L3 nerve root and showed good
epidural spread. No vascular opacification is seen. 120 mg of
Depo-Medrol mixed with 1.5 mL of 1% lidocaine were instilled. The
procedure was well-tolerated, and the patient was discharged thirty
minutes following the injection in good condition.

COMPLICATIONS:
None
IMPRESSION: Technically successful injection consisting of a left L3 nerve root
block and transforaminal epidural.

## 2019-01-15 MED ORDER — METHYLPREDNISOLONE ACETATE 40 MG/ML INJ SUSP (RADIOLOG
120.0000 mg | Freq: Once | INTRAMUSCULAR | Status: AC
  Administered 2019-01-15: 120 mg via EPIDURAL

## 2019-01-15 MED ORDER — IOPAMIDOL (ISOVUE-M 200) INJECTION 41%
1.0000 mL | Freq: Once | INTRAMUSCULAR | Status: AC
  Administered 2019-01-15: 1 mL via EPIDURAL

## 2019-01-15 NOTE — Discharge Instructions (Signed)

## 2019-02-02 DIAGNOSIS — M5416 Radiculopathy, lumbar region: Secondary | ICD-10-CM | POA: Diagnosis not present

## 2019-03-13 DIAGNOSIS — Z0000 Encounter for general adult medical examination without abnormal findings: Secondary | ICD-10-CM | POA: Diagnosis not present

## 2019-03-13 DIAGNOSIS — Z1331 Encounter for screening for depression: Secondary | ICD-10-CM | POA: Diagnosis not present

## 2019-03-13 DIAGNOSIS — Z1339 Encounter for screening examination for other mental health and behavioral disorders: Secondary | ICD-10-CM | POA: Diagnosis not present

## 2019-03-13 DIAGNOSIS — E039 Hypothyroidism, unspecified: Secondary | ICD-10-CM | POA: Diagnosis not present

## 2019-03-13 DIAGNOSIS — E2839 Other primary ovarian failure: Secondary | ICD-10-CM | POA: Diagnosis not present

## 2019-03-13 DIAGNOSIS — Z1231 Encounter for screening mammogram for malignant neoplasm of breast: Secondary | ICD-10-CM | POA: Diagnosis not present

## 2019-04-06 DIAGNOSIS — Z981 Arthrodesis status: Secondary | ICD-10-CM | POA: Diagnosis not present

## 2019-04-06 DIAGNOSIS — M5136 Other intervertebral disc degeneration, lumbar region: Secondary | ICD-10-CM | POA: Diagnosis not present

## 2019-04-06 DIAGNOSIS — M50323 Other cervical disc degeneration at C6-C7 level: Secondary | ICD-10-CM | POA: Diagnosis not present

## 2019-04-06 DIAGNOSIS — G8929 Other chronic pain: Secondary | ICD-10-CM | POA: Diagnosis not present

## 2019-04-06 DIAGNOSIS — M4726 Other spondylosis with radiculopathy, lumbar region: Secondary | ICD-10-CM | POA: Diagnosis not present

## 2019-04-06 DIAGNOSIS — M50322 Other cervical disc degeneration at C5-C6 level: Secondary | ICD-10-CM | POA: Diagnosis not present

## 2019-04-06 DIAGNOSIS — Z79891 Long term (current) use of opiate analgesic: Secondary | ICD-10-CM | POA: Diagnosis not present

## 2019-04-06 DIAGNOSIS — Z6832 Body mass index (BMI) 32.0-32.9, adult: Secondary | ICD-10-CM | POA: Diagnosis not present

## 2019-04-06 DIAGNOSIS — E6609 Other obesity due to excess calories: Secondary | ICD-10-CM | POA: Diagnosis not present

## 2019-04-06 DIAGNOSIS — G629 Polyneuropathy, unspecified: Secondary | ICD-10-CM | POA: Diagnosis not present

## 2019-04-10 DIAGNOSIS — K219 Gastro-esophageal reflux disease without esophagitis: Secondary | ICD-10-CM | POA: Diagnosis not present

## 2019-04-10 DIAGNOSIS — J449 Chronic obstructive pulmonary disease, unspecified: Secondary | ICD-10-CM | POA: Diagnosis not present

## 2019-04-10 DIAGNOSIS — E1142 Type 2 diabetes mellitus with diabetic polyneuropathy: Secondary | ICD-10-CM | POA: Diagnosis not present

## 2019-04-10 DIAGNOSIS — K5900 Constipation, unspecified: Secondary | ICD-10-CM | POA: Diagnosis not present

## 2019-04-10 DIAGNOSIS — I10 Essential (primary) hypertension: Secondary | ICD-10-CM | POA: Diagnosis not present

## 2019-04-10 DIAGNOSIS — E039 Hypothyroidism, unspecified: Secondary | ICD-10-CM | POA: Diagnosis not present

## 2019-04-10 DIAGNOSIS — B379 Candidiasis, unspecified: Secondary | ICD-10-CM | POA: Diagnosis not present

## 2019-04-10 DIAGNOSIS — G8929 Other chronic pain: Secondary | ICD-10-CM | POA: Diagnosis not present

## 2019-04-10 DIAGNOSIS — E669 Obesity, unspecified: Secondary | ICD-10-CM | POA: Diagnosis not present

## 2019-04-10 DIAGNOSIS — J302 Other seasonal allergic rhinitis: Secondary | ICD-10-CM | POA: Diagnosis not present

## 2019-04-12 DIAGNOSIS — Z20828 Contact with and (suspected) exposure to other viral communicable diseases: Secondary | ICD-10-CM | POA: Diagnosis not present

## 2019-05-08 DIAGNOSIS — B372 Candidiasis of skin and nail: Secondary | ICD-10-CM | POA: Diagnosis not present

## 2019-05-08 DIAGNOSIS — Z6833 Body mass index (BMI) 33.0-33.9, adult: Secondary | ICD-10-CM | POA: Diagnosis not present

## 2019-05-08 DIAGNOSIS — Z79899 Other long term (current) drug therapy: Secondary | ICD-10-CM | POA: Diagnosis not present

## 2019-05-08 DIAGNOSIS — E039 Hypothyroidism, unspecified: Secondary | ICD-10-CM | POA: Diagnosis not present

## 2019-05-08 DIAGNOSIS — E785 Hyperlipidemia, unspecified: Secondary | ICD-10-CM | POA: Diagnosis not present

## 2019-05-08 DIAGNOSIS — I10 Essential (primary) hypertension: Secondary | ICD-10-CM | POA: Diagnosis not present

## 2019-05-08 DIAGNOSIS — E1142 Type 2 diabetes mellitus with diabetic polyneuropathy: Secondary | ICD-10-CM | POA: Diagnosis not present

## 2019-05-08 DIAGNOSIS — K219 Gastro-esophageal reflux disease without esophagitis: Secondary | ICD-10-CM | POA: Diagnosis not present

## 2019-06-12 ENCOUNTER — Ambulatory Visit: Payer: Medicare HMO | Admitting: Podiatry

## 2019-06-15 ENCOUNTER — Encounter: Payer: Self-pay | Admitting: Sports Medicine

## 2019-06-15 ENCOUNTER — Other Ambulatory Visit: Payer: Self-pay

## 2019-06-15 ENCOUNTER — Ambulatory Visit: Payer: Medicare HMO | Admitting: Sports Medicine

## 2019-06-15 VITALS — Temp 97.6°F | Resp 16

## 2019-06-15 DIAGNOSIS — M79674 Pain in right toe(s): Secondary | ICD-10-CM

## 2019-06-15 DIAGNOSIS — M2042 Other hammer toe(s) (acquired), left foot: Secondary | ICD-10-CM

## 2019-06-15 DIAGNOSIS — M2140 Flat foot [pes planus] (acquired), unspecified foot: Secondary | ICD-10-CM | POA: Diagnosis not present

## 2019-06-15 DIAGNOSIS — E1142 Type 2 diabetes mellitus with diabetic polyneuropathy: Secondary | ICD-10-CM

## 2019-06-15 DIAGNOSIS — M2041 Other hammer toe(s) (acquired), right foot: Secondary | ICD-10-CM | POA: Diagnosis not present

## 2019-06-15 DIAGNOSIS — B351 Tinea unguium: Secondary | ICD-10-CM | POA: Diagnosis not present

## 2019-06-15 DIAGNOSIS — M79675 Pain in left toe(s): Secondary | ICD-10-CM

## 2019-06-15 DIAGNOSIS — S90414A Abrasion, right lesser toe(s), initial encounter: Secondary | ICD-10-CM

## 2019-06-15 DIAGNOSIS — L600 Ingrowing nail: Secondary | ICD-10-CM | POA: Diagnosis not present

## 2019-06-15 NOTE — Progress Notes (Signed)
Subjective: Becky Stevens is a 78 y.o. female patient with history of diabetes who presents to office today complaining of long,mildly painful nails  while ambulating in shoes; unable to trim especially with the most pain at the right hallux medial margin reports that about a month ago she went for a pedicure and after that she started having some pain at the medial hallux nail margin that has been around about the last 2 to 3 weeks.  Patient denies redness warmth swelling. Patient states that the glucose reading this morning was 102 and last A1c was 6.2.  Patient denies any new changes in medication or new problems. Patient denies any new cramping, numbness, burning or tingling in the legs but does admit to a history of neuropathy.  Review of Systems  All other systems reviewed and are negative.   Patient Active Problem List   Diagnosis Date Noted  . Class 1 obesity due to excess calories without serious comorbidity with body mass index (BMI) of 32.0 to 32.9 in adult 01/12/2019  . Pain medication agreement 01/12/2019  . Arthritis 02/28/2018  . Chronic bilateral low back pain with bilateral sciatica 02/28/2018  . DDD (degenerative disc disease), cervical 02/28/2018  . Facet joint disease 02/28/2018  . Muscle spasm of back 02/28/2018  . Neck pain 02/28/2018  . Neuropathy 02/28/2018  . Other chronic pain 02/28/2018  . S/P laminectomy with spinal fusion 02/28/2018  . Therapeutic opioid-induced constipation (OIC) 02/28/2018   No current outpatient medications on file prior to visit.   No current facility-administered medications on file prior to visit.    Allergies  Allergen Reactions  . Azithromycin   . Celecoxib   . Lisinopril   . Morphine   . Nitrofurantoin   . Quinapril     No results found for this or any previous visit (from the past 2160 hour(s)).  Objective: General: Patient is awake, alert, and oriented x 3 and in no acute distress.  Integument: Skin is warm, dry and  supple bilateral. Nails are minimally elongated, 1-5 bilateral with mild incurvation at the right hallux medial margin without signs of infection.  Abrasion noted to the right fourth toe noninfected.  No open lesions or preulcerative lesions present bilateral. Remaining integument unremarkable.  Vasculature:  Dorsalis Pedis pulse 1/4 bilateral. Posterior Tibial pulse  1/4 bilateral. Capillary fill time <3 sec 1-5 bilateral. Positive hair growth to the level of the digits.Temperature gradient within normal limits. No varicosities present bilateral. No edema present bilateral.   Neurology: The patient has diminished sensation measured with a 5.07/10g Semmes Weinstein Monofilament at all pedal sites bilateral . Vibratory sensation diminished bilateral with tuning fork. No Babinski sign present bilateral.   Musculoskeletal: Asymptomatic hammertoes and pes planus pedal deformities noted bilateral. Muscular strength 5/5 in all lower extremity muscular groups bilateral without pain on range of motion . No tenderness with calf compression bilateral.  Assessment and Plan: Problem List Items Addressed This Visit    None    Visit Diagnoses    Pain due to onychomycosis of toenails of both feet    -  Primary   Ingrowing nail       Diabetic polyneuropathy associated with type 2 diabetes mellitus (HCC)       Hammer toes of both feet       Pes planus, unspecified laterality       Abrasion of fourth toe of right foot, initial encounter       Bumped on screen door over a week  ago       -Examined patient. -Discussed and educated patient on diabetic foot care, especially with  regards to the vascular, neurological and musculoskeletal systems.  -Stressed the importance of good glycemic control and the detriment of not  controlling glucose levels in relation to the foot. -Mechanically debrided all nails 1-5 bilateral using sterile nail nipper and filed with dremel without incident and removed any offending  borders -Advised patient to apply antibiotic cream to right fourth toe until area is healed -Dispensed toe spacers for hammertoes -[employer] diabetic shoe order form was completed; office to contact primary care for approval / certification;  Office to arrange shoe fitting and dispensing. -Answered all patient questions -Patient to return  in 3 months for at risk foot care and as scheduled for diabetic shoe measurements -Patient advised to call the office if any problems or questions arise in the meantime.  Landis Martins, DPM

## 2019-06-28 DIAGNOSIS — G629 Polyneuropathy, unspecified: Secondary | ICD-10-CM | POA: Diagnosis not present

## 2019-06-28 DIAGNOSIS — M5441 Lumbago with sciatica, right side: Secondary | ICD-10-CM | POA: Diagnosis not present

## 2019-06-28 DIAGNOSIS — M5136 Other intervertebral disc degeneration, lumbar region: Secondary | ICD-10-CM | POA: Diagnosis not present

## 2019-06-28 DIAGNOSIS — Z79891 Long term (current) use of opiate analgesic: Secondary | ICD-10-CM | POA: Diagnosis not present

## 2019-06-28 DIAGNOSIS — G8929 Other chronic pain: Secondary | ICD-10-CM

## 2019-06-28 DIAGNOSIS — M5442 Lumbago with sciatica, left side: Secondary | ICD-10-CM | POA: Diagnosis not present

## 2019-06-28 DIAGNOSIS — Z981 Arthrodesis status: Secondary | ICD-10-CM | POA: Diagnosis not present

## 2019-06-28 DIAGNOSIS — M25562 Pain in left knee: Secondary | ICD-10-CM | POA: Diagnosis not present

## 2019-06-28 DIAGNOSIS — Z760 Encounter for issue of repeat prescription: Secondary | ICD-10-CM | POA: Diagnosis not present

## 2019-06-28 HISTORY — DX: Other chronic pain: G89.29

## 2019-07-04 ENCOUNTER — Ambulatory Visit: Payer: Medicare HMO | Admitting: Orthotics

## 2019-07-04 ENCOUNTER — Other Ambulatory Visit: Payer: Self-pay

## 2019-07-04 DIAGNOSIS — M2042 Other hammer toe(s) (acquired), left foot: Secondary | ICD-10-CM

## 2019-07-04 DIAGNOSIS — B351 Tinea unguium: Secondary | ICD-10-CM

## 2019-07-04 DIAGNOSIS — E1142 Type 2 diabetes mellitus with diabetic polyneuropathy: Secondary | ICD-10-CM

## 2019-07-04 DIAGNOSIS — M2140 Flat foot [pes planus] (acquired), unspecified foot: Secondary | ICD-10-CM

## 2019-07-04 DIAGNOSIS — M79675 Pain in left toe(s): Secondary | ICD-10-CM

## 2019-07-04 DIAGNOSIS — M2041 Other hammer toe(s) (acquired), right foot: Secondary | ICD-10-CM

## 2019-07-04 NOTE — Progress Notes (Signed)

## 2019-07-24 DIAGNOSIS — R69 Illness, unspecified: Secondary | ICD-10-CM | POA: Diagnosis not present

## 2019-07-24 DIAGNOSIS — R946 Abnormal results of thyroid function studies: Secondary | ICD-10-CM | POA: Diagnosis not present

## 2019-07-24 DIAGNOSIS — R300 Dysuria: Secondary | ICD-10-CM | POA: Diagnosis not present

## 2019-07-26 DIAGNOSIS — M4726 Other spondylosis with radiculopathy, lumbar region: Secondary | ICD-10-CM | POA: Diagnosis not present

## 2019-07-26 DIAGNOSIS — M5442 Lumbago with sciatica, left side: Secondary | ICD-10-CM | POA: Diagnosis not present

## 2019-07-26 DIAGNOSIS — M5030 Other cervical disc degeneration, unspecified cervical region: Secondary | ICD-10-CM | POA: Diagnosis not present

## 2019-07-26 DIAGNOSIS — Z79891 Long term (current) use of opiate analgesic: Secondary | ICD-10-CM | POA: Diagnosis not present

## 2019-07-26 DIAGNOSIS — Z760 Encounter for issue of repeat prescription: Secondary | ICD-10-CM | POA: Diagnosis not present

## 2019-07-26 DIAGNOSIS — M62830 Muscle spasm of back: Secondary | ICD-10-CM | POA: Diagnosis not present

## 2019-07-26 DIAGNOSIS — G8929 Other chronic pain: Secondary | ICD-10-CM | POA: Diagnosis not present

## 2019-07-26 DIAGNOSIS — R69 Illness, unspecified: Secondary | ICD-10-CM | POA: Diagnosis not present

## 2019-07-26 DIAGNOSIS — M5136 Other intervertebral disc degeneration, lumbar region: Secondary | ICD-10-CM | POA: Diagnosis not present

## 2019-07-26 DIAGNOSIS — G629 Polyneuropathy, unspecified: Secondary | ICD-10-CM | POA: Diagnosis not present

## 2019-07-26 DIAGNOSIS — M5441 Lumbago with sciatica, right side: Secondary | ICD-10-CM | POA: Diagnosis not present

## 2019-08-15 ENCOUNTER — Other Ambulatory Visit: Payer: Medicare HMO

## 2019-09-03 DIAGNOSIS — R69 Illness, unspecified: Secondary | ICD-10-CM | POA: Diagnosis not present

## 2019-09-04 DIAGNOSIS — R69 Illness, unspecified: Secondary | ICD-10-CM | POA: Diagnosis not present

## 2019-09-14 ENCOUNTER — Other Ambulatory Visit: Payer: Self-pay

## 2019-09-14 ENCOUNTER — Encounter: Payer: Self-pay | Admitting: Sports Medicine

## 2019-09-14 ENCOUNTER — Ambulatory Visit (INDEPENDENT_AMBULATORY_CARE_PROVIDER_SITE_OTHER): Payer: Medicare HMO | Admitting: Sports Medicine

## 2019-09-14 DIAGNOSIS — E1140 Type 2 diabetes mellitus with diabetic neuropathy, unspecified: Secondary | ICD-10-CM

## 2019-09-14 DIAGNOSIS — M79675 Pain in left toe(s): Secondary | ICD-10-CM | POA: Diagnosis not present

## 2019-09-14 DIAGNOSIS — M2042 Other hammer toe(s) (acquired), left foot: Secondary | ICD-10-CM | POA: Diagnosis not present

## 2019-09-14 DIAGNOSIS — E1142 Type 2 diabetes mellitus with diabetic polyneuropathy: Secondary | ICD-10-CM

## 2019-09-14 DIAGNOSIS — M2041 Other hammer toe(s) (acquired), right foot: Secondary | ICD-10-CM | POA: Diagnosis not present

## 2019-09-14 DIAGNOSIS — M2140 Flat foot [pes planus] (acquired), unspecified foot: Secondary | ICD-10-CM

## 2019-09-14 DIAGNOSIS — B351 Tinea unguium: Secondary | ICD-10-CM

## 2019-09-14 DIAGNOSIS — M79674 Pain in right toe(s): Secondary | ICD-10-CM

## 2019-09-14 NOTE — Progress Notes (Addendum)
Subjective: Becky Stevens is a 78 y.o. female patient with history of diabetes who presents to office today complaining of long,mildly painful nails  while ambulating in shoes; unable to trim. Patient states that the glucose reading this morning was 137 mg/dl. A1c 6.7. Patient denies any new changes in medication or new problems. Last PCP visit was 2 months ago. Reports that she had a hard time keeping the toe spacers in between her toes. No other issues noted.   Patient Active Problem List   Diagnosis Date Noted  . Class 1 obesity due to excess calories without serious comorbidity with body mass index (BMI) of 32.0 to 32.9 in adult 01/12/2019  . Pain medication agreement 01/12/2019  . Arthritis 02/28/2018  . Chronic bilateral low back pain with bilateral sciatica 02/28/2018  . DDD (degenerative disc disease), cervical 02/28/2018  . Facet joint disease 02/28/2018  . Muscle spasm of back 02/28/2018  . Neck pain 02/28/2018  . Neuropathy 02/28/2018  . Other chronic pain 02/28/2018  . S/P laminectomy with spinal fusion 02/28/2018  . Therapeutic opioid-induced constipation (OIC) 02/28/2018   Current Outpatient Medications on File Prior to Visit  Medication Sig Dispense Refill  . buprenorphine (BUTRANS) 20 MCG/HR PTWK patch     . DULoxetine (CYMBALTA) 60 MG capsule Take 60 mg by mouth daily.    . fluconazole (DIFLUCAN) 150 MG tablet Take 150 mg by mouth daily.    Marland Kitchen glimepiride (AMARYL) 1 MG tablet Take 1 mg by mouth daily.    Marland Kitchen JANUVIA 100 MG tablet Take 100 mg by mouth daily.    . metFORMIN (GLUCOPHAGE-XR) 500 MG 24 hr tablet Take 500 mg by mouth 2 (two) times daily.    . montelukast (SINGULAIR) 10 MG tablet Take 10 mg by mouth daily.    Marland Kitchen nystatin cream (MYCOSTATIN) APPLY TO THE AFFECTED AREA TO RASH UNDER BREASTS TWICE DAILY FOR 10 DAYS    . omeprazole (PRILOSEC) 20 MG capsule Take by mouth.     No current facility-administered medications on file prior to visit.    Allergies  Allergen  Reactions  . Azithromycin   . Celecoxib   . Lisinopril   . Morphine   . Nitrofurantoin   . Quinapril     No results found for this or any previous visit (from the past 2160 hour(s)).  Objective: General: Patient is awake, alert, and oriented x 3 and in no acute distress.  Integument: Skin is warm, dry and supple bilateral. Nails are tender, long, thickened and  dystrophic with subungual debris, consistent with onychomycosis, 1-5 bilateral. No signs of infection. No open lesions or preulcerative lesions present bilateral. Remaining integument unremarkable.  Vasculature:  Dorsalis Pedis pulse 1/4 bilateral. Posterior Tibial pulse  1/4 bilateral.  Capillary fill time <3 sec 1-5 bilateral. Positive hair growth to the level of the digits. Temperature gradient within normal limits. No varicosities present bilateral. No edema present bilateral.   Neurology: The patient has diminished sensation measured with a 5.07/10g Semmes Weinstein Monofilament at all pedal sites bilateral . Vibratory sensation diminished bilateral with tuning fork. No Babinski sign present bilateral.   Musculoskeletal: Asymptomatic hammertoes and pes planus pedal deformities noted bilateral. Muscular strength 5/5 in all lower extremity muscular groups bilateral without pain on range of motion . No tenderness with calf compression bilateral.  Assessment and Plan: Problem List Items Addressed This Visit    None    Visit Diagnoses    Pain due to onychomycosis of toenails of both feet    -  Primary   Diabetic polyneuropathy associated with type 2 diabetes mellitus (HCC)       Pes planus, unspecified laterality       Hammer toes of both feet         -Examined patient. -Discussed and educated patient on diabetic foot care, especially with  regards to the vascular, neurological and musculoskeletal systems.  -Stressed the importance of good glycemic control and the detriment of not  controlling glucose levels in relation  to the foot. -Mechanically debrided all nails 1-5 bilateral using sterile nail nipper and filed with dremel without incident  -Diabetic shoes were dispensed at this visit proper fit patient was able to walk 10 feet without pain or discomfort where an break-in pattern/use explain -Dispensed new toe cushions -Answered all patient questions -Patient to return  in 3 months for at risk foot care -Patient advised to call the office if any problems or questions arise in the meantime.  Landis Martins, DPM

## 2019-09-19 DIAGNOSIS — I10 Essential (primary) hypertension: Secondary | ICD-10-CM | POA: Diagnosis not present

## 2019-09-19 DIAGNOSIS — R300 Dysuria: Secondary | ICD-10-CM | POA: Diagnosis not present

## 2019-09-19 DIAGNOSIS — N390 Urinary tract infection, site not specified: Secondary | ICD-10-CM | POA: Diagnosis not present

## 2019-09-19 DIAGNOSIS — Z79899 Other long term (current) drug therapy: Secondary | ICD-10-CM | POA: Diagnosis not present

## 2019-09-19 DIAGNOSIS — J449 Chronic obstructive pulmonary disease, unspecified: Secondary | ICD-10-CM | POA: Diagnosis not present

## 2019-09-19 DIAGNOSIS — M329 Systemic lupus erythematosus, unspecified: Secondary | ICD-10-CM | POA: Diagnosis not present

## 2019-09-19 DIAGNOSIS — E1142 Type 2 diabetes mellitus with diabetic polyneuropathy: Secondary | ICD-10-CM | POA: Diagnosis not present

## 2019-09-19 DIAGNOSIS — M150 Primary generalized (osteo)arthritis: Secondary | ICD-10-CM | POA: Diagnosis not present

## 2019-09-19 DIAGNOSIS — E669 Obesity, unspecified: Secondary | ICD-10-CM | POA: Diagnosis not present

## 2019-09-19 DIAGNOSIS — E785 Hyperlipidemia, unspecified: Secondary | ICD-10-CM | POA: Diagnosis not present

## 2019-09-19 DIAGNOSIS — Z1331 Encounter for screening for depression: Secondary | ICD-10-CM | POA: Diagnosis not present

## 2019-10-01 DIAGNOSIS — B370 Candidal stomatitis: Secondary | ICD-10-CM | POA: Diagnosis not present

## 2019-10-23 DIAGNOSIS — M1712 Unilateral primary osteoarthritis, left knee: Secondary | ICD-10-CM | POA: Diagnosis not present

## 2019-10-25 DIAGNOSIS — G8929 Other chronic pain: Secondary | ICD-10-CM | POA: Diagnosis not present

## 2019-10-25 DIAGNOSIS — Z760 Encounter for issue of repeat prescription: Secondary | ICD-10-CM | POA: Diagnosis not present

## 2019-10-25 DIAGNOSIS — M5136 Other intervertebral disc degeneration, lumbar region: Secondary | ICD-10-CM | POA: Diagnosis not present

## 2019-10-25 DIAGNOSIS — Z981 Arthrodesis status: Secondary | ICD-10-CM | POA: Diagnosis not present

## 2019-10-25 DIAGNOSIS — M25562 Pain in left knee: Secondary | ICD-10-CM | POA: Diagnosis not present

## 2019-10-25 DIAGNOSIS — M5030 Other cervical disc degeneration, unspecified cervical region: Secondary | ICD-10-CM | POA: Diagnosis not present

## 2019-10-25 DIAGNOSIS — Z79891 Long term (current) use of opiate analgesic: Secondary | ICD-10-CM | POA: Diagnosis not present

## 2019-10-25 DIAGNOSIS — K5903 Drug induced constipation: Secondary | ICD-10-CM | POA: Diagnosis not present

## 2019-10-25 DIAGNOSIS — T402X5D Adverse effect of other opioids, subsequent encounter: Secondary | ICD-10-CM | POA: Diagnosis not present

## 2019-10-25 DIAGNOSIS — M62830 Muscle spasm of back: Secondary | ICD-10-CM | POA: Diagnosis not present

## 2019-12-14 DIAGNOSIS — E785 Hyperlipidemia, unspecified: Secondary | ICD-10-CM | POA: Diagnosis not present

## 2019-12-14 DIAGNOSIS — J449 Chronic obstructive pulmonary disease, unspecified: Secondary | ICD-10-CM | POA: Diagnosis not present

## 2019-12-14 DIAGNOSIS — G8929 Other chronic pain: Secondary | ICD-10-CM | POA: Diagnosis not present

## 2019-12-14 DIAGNOSIS — E669 Obesity, unspecified: Secondary | ICD-10-CM | POA: Diagnosis not present

## 2019-12-14 DIAGNOSIS — E039 Hypothyroidism, unspecified: Secondary | ICD-10-CM | POA: Diagnosis not present

## 2019-12-14 DIAGNOSIS — M3219 Other organ or system involvement in systemic lupus erythematosus: Secondary | ICD-10-CM | POA: Diagnosis not present

## 2019-12-14 DIAGNOSIS — E1142 Type 2 diabetes mellitus with diabetic polyneuropathy: Secondary | ICD-10-CM | POA: Diagnosis not present

## 2019-12-14 DIAGNOSIS — J302 Other seasonal allergic rhinitis: Secondary | ICD-10-CM | POA: Diagnosis not present

## 2019-12-14 DIAGNOSIS — I10 Essential (primary) hypertension: Secondary | ICD-10-CM | POA: Diagnosis not present

## 2019-12-14 DIAGNOSIS — K219 Gastro-esophageal reflux disease without esophagitis: Secondary | ICD-10-CM | POA: Diagnosis not present

## 2019-12-21 ENCOUNTER — Encounter: Payer: Self-pay | Admitting: Sports Medicine

## 2019-12-21 ENCOUNTER — Other Ambulatory Visit: Payer: Self-pay

## 2019-12-21 ENCOUNTER — Ambulatory Visit (INDEPENDENT_AMBULATORY_CARE_PROVIDER_SITE_OTHER): Payer: Medicare HMO | Admitting: Sports Medicine

## 2019-12-21 DIAGNOSIS — B351 Tinea unguium: Secondary | ICD-10-CM | POA: Diagnosis not present

## 2019-12-21 DIAGNOSIS — E1142 Type 2 diabetes mellitus with diabetic polyneuropathy: Secondary | ICD-10-CM | POA: Diagnosis not present

## 2019-12-21 DIAGNOSIS — S90411A Abrasion, right great toe, initial encounter: Secondary | ICD-10-CM

## 2019-12-21 DIAGNOSIS — M79675 Pain in left toe(s): Secondary | ICD-10-CM

## 2019-12-21 DIAGNOSIS — M79674 Pain in right toe(s): Secondary | ICD-10-CM

## 2019-12-21 NOTE — Progress Notes (Signed)
Subjective: Becky Stevens is a 79 y.o. female patient with history of diabetes who presents to office today complaining of long,mildly painful nails  while ambulating in shoes; unable to trim. Patient states that the glucose reading this morning was 138 mg/dl. A1c 6.2. Patient denies any new changes in medication or new problems. Last PCP visit was 2 months ago. No other issues noted.   Patient Active Problem List   Diagnosis Date Noted  . Class 1 obesity due to excess calories without serious comorbidity with body mass index (BMI) of 32.0 to 32.9 in adult 01/12/2019  . Pain medication agreement 01/12/2019  . Arthritis 02/28/2018  . Chronic bilateral low back pain with bilateral sciatica 02/28/2018  . DDD (degenerative disc disease), cervical 02/28/2018  . Facet joint disease 02/28/2018  . Muscle spasm of back 02/28/2018  . Neck pain 02/28/2018  . Neuropathy 02/28/2018  . Other chronic pain 02/28/2018  . S/P laminectomy with spinal fusion 02/28/2018  . Therapeutic opioid-induced constipation (OIC) 02/28/2018   Current Outpatient Medications on File Prior to Visit  Medication Sig Dispense Refill  . buprenorphine (BUTRANS) 20 MCG/HR PTWK patch     . DULoxetine (CYMBALTA) 60 MG capsule Take 60 mg by mouth daily.    . fluconazole (DIFLUCAN) 150 MG tablet Take 150 mg by mouth daily.    Marland Kitchen glimepiride (AMARYL) 1 MG tablet Take 1 mg by mouth daily.    Marland Kitchen JANUVIA 100 MG tablet Take 100 mg by mouth daily.    . metFORMIN (GLUCOPHAGE-XR) 500 MG 24 hr tablet Take 500 mg by mouth 2 (two) times daily.    . montelukast (SINGULAIR) 10 MG tablet Take 10 mg by mouth daily.    Marland Kitchen nystatin cream (MYCOSTATIN) APPLY TO THE AFFECTED AREA TO RASH UNDER BREASTS TWICE DAILY FOR 10 DAYS    . omeprazole (PRILOSEC) 20 MG capsule Take by mouth.     No current facility-administered medications on file prior to visit.   Allergies  Allergen Reactions  . Azithromycin   . Celecoxib   . Lisinopril   . Morphine   .  Nitrofurantoin   . Quinapril     No results found for this or any previous visit (from the past 2160 hour(s)).  Objective: General: Patient is awake, alert, and oriented x 3 and in no acute distress.  Integument: Skin is warm, dry and supple bilateral. Nails are tender, long, thickened and dystrophic with subungual debris, consistent with onychomycosis, 1-5 bilateral. Abrasion to right hallux from patient trying to cut toenails, No signs of infection. No open lesions or preulcerative lesions present bilateral. Remaining integument unremarkable.  Vasculature:  Dorsalis Pedis pulse 1/4 bilateral. Posterior Tibial pulse  1/4 bilateral.  Capillary fill time <3 sec 1-5 bilateral. Positive hair growth to the level of the digits. Temperature gradient within normal limits. No varicosities present bilateral. No edema present bilateral.   Neurology: The patient has diminished sensation measured with a 5.07/10g Semmes Weinstein Monofilament at all pedal sites bilateral . Vibratory sensation diminished bilateral with tuning fork. No Babinski sign present bilateral.   Musculoskeletal: Asymptomatic hammertoes and pes planus pedal deformities noted bilateral. Muscular strength 5/5 in all lower extremity muscular groups bilateral without pain on range of motion . No tenderness with calf compression bilateral.  Assessment and Plan: Problem List Items Addressed This Visit    None    Visit Diagnoses    Pain due to onychomycosis of toenails of both feet    -  Primary  Diabetic polyneuropathy associated with type 2 diabetes mellitus (HCC)       Abrasion of right great toe, initial encounter         -Examined patient. -Re-Discussed and educated patient on diabetic foot care, especially with  regards to the vascular, neurological and musculoskeletal systems.  -Mechanically debrided all nails 1-5 bilateral using sterile nail nipper and filed with dremel without incident  -Advised patient to refrain from  cutting nails herself and to treat abrasion at right 1st toe with neosporin as needed and closely monitor if worsens to return to office  -Continue with diabetic shoes  -Answered all patient questions -Patient to return  in 3 months for at risk foot care -Patient advised to call the office if any problems or questions arise in the meantime.  Landis Martins, DPM

## 2020-01-17 DIAGNOSIS — Z6833 Body mass index (BMI) 33.0-33.9, adult: Secondary | ICD-10-CM | POA: Diagnosis not present

## 2020-01-17 DIAGNOSIS — Z1331 Encounter for screening for depression: Secondary | ICD-10-CM | POA: Diagnosis not present

## 2020-01-17 DIAGNOSIS — M329 Systemic lupus erythematosus, unspecified: Secondary | ICD-10-CM | POA: Diagnosis not present

## 2020-01-17 DIAGNOSIS — N390 Urinary tract infection, site not specified: Secondary | ICD-10-CM | POA: Diagnosis not present

## 2020-01-17 DIAGNOSIS — E1142 Type 2 diabetes mellitus with diabetic polyneuropathy: Secondary | ICD-10-CM | POA: Diagnosis not present

## 2020-01-17 DIAGNOSIS — E039 Hypothyroidism, unspecified: Secondary | ICD-10-CM | POA: Diagnosis not present

## 2020-01-17 DIAGNOSIS — Z79899 Other long term (current) drug therapy: Secondary | ICD-10-CM | POA: Diagnosis not present

## 2020-01-17 DIAGNOSIS — I10 Essential (primary) hypertension: Secondary | ICD-10-CM | POA: Diagnosis not present

## 2020-01-17 DIAGNOSIS — J449 Chronic obstructive pulmonary disease, unspecified: Secondary | ICD-10-CM | POA: Diagnosis not present

## 2020-01-17 DIAGNOSIS — E785 Hyperlipidemia, unspecified: Secondary | ICD-10-CM | POA: Diagnosis not present

## 2020-01-22 DIAGNOSIS — M545 Low back pain: Secondary | ICD-10-CM | POA: Diagnosis not present

## 2020-01-22 DIAGNOSIS — Z981 Arthrodesis status: Secondary | ICD-10-CM | POA: Diagnosis not present

## 2020-01-22 DIAGNOSIS — T402X5D Adverse effect of other opioids, subsequent encounter: Secondary | ICD-10-CM | POA: Diagnosis not present

## 2020-01-22 DIAGNOSIS — Z79891 Long term (current) use of opiate analgesic: Secondary | ICD-10-CM | POA: Diagnosis not present

## 2020-01-22 DIAGNOSIS — M62830 Muscle spasm of back: Secondary | ICD-10-CM | POA: Diagnosis not present

## 2020-01-22 DIAGNOSIS — Z760 Encounter for issue of repeat prescription: Secondary | ICD-10-CM | POA: Diagnosis not present

## 2020-01-22 DIAGNOSIS — G8929 Other chronic pain: Secondary | ICD-10-CM | POA: Diagnosis not present

## 2020-01-22 DIAGNOSIS — M25561 Pain in right knee: Secondary | ICD-10-CM | POA: Diagnosis not present

## 2020-01-22 DIAGNOSIS — M25562 Pain in left knee: Secondary | ICD-10-CM | POA: Diagnosis not present

## 2020-01-22 DIAGNOSIS — K5903 Drug induced constipation: Secondary | ICD-10-CM | POA: Diagnosis not present

## 2020-01-23 DIAGNOSIS — M1712 Unilateral primary osteoarthritis, left knee: Secondary | ICD-10-CM | POA: Diagnosis not present

## 2020-03-21 ENCOUNTER — Ambulatory Visit: Payer: Medicare HMO | Admitting: Sports Medicine

## 2020-04-22 DIAGNOSIS — M150 Primary generalized (osteo)arthritis: Secondary | ICD-10-CM | POA: Diagnosis not present

## 2020-04-22 DIAGNOSIS — Z0000 Encounter for general adult medical examination without abnormal findings: Secondary | ICD-10-CM | POA: Diagnosis not present

## 2020-04-24 DIAGNOSIS — M25561 Pain in right knee: Secondary | ICD-10-CM | POA: Diagnosis not present

## 2020-04-24 DIAGNOSIS — Z981 Arthrodesis status: Secondary | ICD-10-CM | POA: Diagnosis not present

## 2020-04-24 DIAGNOSIS — G8929 Other chronic pain: Secondary | ICD-10-CM | POA: Diagnosis not present

## 2020-04-24 DIAGNOSIS — M25562 Pain in left knee: Secondary | ICD-10-CM | POA: Diagnosis not present

## 2020-04-24 DIAGNOSIS — M5136 Other intervertebral disc degeneration, lumbar region: Secondary | ICD-10-CM | POA: Diagnosis not present

## 2020-05-02 DIAGNOSIS — M1712 Unilateral primary osteoarthritis, left knee: Secondary | ICD-10-CM | POA: Diagnosis not present

## 2020-05-20 DIAGNOSIS — Z1231 Encounter for screening mammogram for malignant neoplasm of breast: Secondary | ICD-10-CM | POA: Diagnosis not present

## 2020-06-11 DIAGNOSIS — M069 Rheumatoid arthritis, unspecified: Secondary | ICD-10-CM | POA: Diagnosis not present

## 2020-06-11 DIAGNOSIS — M19042 Primary osteoarthritis, left hand: Secondary | ICD-10-CM | POA: Diagnosis not present

## 2020-06-19 DIAGNOSIS — D2239 Melanocytic nevi of other parts of face: Secondary | ICD-10-CM | POA: Diagnosis not present

## 2020-06-19 DIAGNOSIS — D1801 Hemangioma of skin and subcutaneous tissue: Secondary | ICD-10-CM | POA: Diagnosis not present

## 2020-06-19 DIAGNOSIS — D485 Neoplasm of uncertain behavior of skin: Secondary | ICD-10-CM | POA: Diagnosis not present

## 2020-06-19 DIAGNOSIS — L820 Inflamed seborrheic keratosis: Secondary | ICD-10-CM | POA: Diagnosis not present

## 2020-06-19 DIAGNOSIS — D225 Melanocytic nevi of trunk: Secondary | ICD-10-CM | POA: Diagnosis not present

## 2020-06-19 DIAGNOSIS — L814 Other melanin hyperpigmentation: Secondary | ICD-10-CM | POA: Diagnosis not present

## 2020-07-24 DIAGNOSIS — I959 Hypotension, unspecified: Secondary | ICD-10-CM | POA: Diagnosis not present

## 2020-07-24 DIAGNOSIS — I700 Atherosclerosis of aorta: Secondary | ICD-10-CM | POA: Diagnosis not present

## 2020-07-24 DIAGNOSIS — M797 Fibromyalgia: Secondary | ICD-10-CM | POA: Diagnosis not present

## 2020-07-24 DIAGNOSIS — M47816 Spondylosis without myelopathy or radiculopathy, lumbar region: Secondary | ICD-10-CM | POA: Diagnosis not present

## 2020-07-24 DIAGNOSIS — M25562 Pain in left knee: Secondary | ICD-10-CM | POA: Diagnosis not present

## 2020-07-24 DIAGNOSIS — G8929 Other chronic pain: Secondary | ICD-10-CM | POA: Diagnosis not present

## 2020-07-24 DIAGNOSIS — M549 Dorsalgia, unspecified: Secondary | ICD-10-CM | POA: Diagnosis not present

## 2020-07-24 DIAGNOSIS — S22070A Wedge compression fracture of T9-T10 vertebra, initial encounter for closed fracture: Secondary | ICD-10-CM | POA: Diagnosis not present

## 2020-07-24 DIAGNOSIS — M5489 Other dorsalgia: Secondary | ICD-10-CM | POA: Diagnosis not present

## 2020-07-24 DIAGNOSIS — Z888 Allergy status to other drugs, medicaments and biological substances status: Secondary | ICD-10-CM | POA: Diagnosis not present

## 2020-07-24 DIAGNOSIS — E876 Hypokalemia: Secondary | ICD-10-CM | POA: Diagnosis not present

## 2020-07-24 DIAGNOSIS — E119 Type 2 diabetes mellitus without complications: Secondary | ICD-10-CM | POA: Diagnosis not present

## 2020-07-24 DIAGNOSIS — I10 Essential (primary) hypertension: Secondary | ICD-10-CM | POA: Diagnosis not present

## 2020-07-24 DIAGNOSIS — R109 Unspecified abdominal pain: Secondary | ICD-10-CM | POA: Diagnosis not present

## 2020-07-24 DIAGNOSIS — M5136 Other intervertebral disc degeneration, lumbar region: Secondary | ICD-10-CM | POA: Diagnosis not present

## 2020-07-24 DIAGNOSIS — Z7984 Long term (current) use of oral hypoglycemic drugs: Secondary | ICD-10-CM | POA: Diagnosis not present

## 2020-07-24 DIAGNOSIS — E1142 Type 2 diabetes mellitus with diabetic polyneuropathy: Secondary | ICD-10-CM | POA: Diagnosis not present

## 2020-07-24 DIAGNOSIS — Z981 Arthrodesis status: Secondary | ICD-10-CM | POA: Diagnosis not present

## 2020-07-24 DIAGNOSIS — E860 Dehydration: Secondary | ICD-10-CM | POA: Diagnosis not present

## 2020-07-24 DIAGNOSIS — J449 Chronic obstructive pulmonary disease, unspecified: Secondary | ICD-10-CM | POA: Diagnosis not present

## 2020-07-24 DIAGNOSIS — R197 Diarrhea, unspecified: Secondary | ICD-10-CM | POA: Diagnosis not present

## 2020-07-24 DIAGNOSIS — K529 Noninfective gastroenteritis and colitis, unspecified: Secondary | ICD-10-CM | POA: Diagnosis not present

## 2020-07-24 DIAGNOSIS — M545 Low back pain: Secondary | ICD-10-CM | POA: Diagnosis not present

## 2020-07-24 DIAGNOSIS — R0902 Hypoxemia: Secondary | ICD-10-CM | POA: Diagnosis not present

## 2020-07-24 DIAGNOSIS — Z885 Allergy status to narcotic agent status: Secondary | ICD-10-CM | POA: Diagnosis not present

## 2020-07-25 DIAGNOSIS — M797 Fibromyalgia: Secondary | ICD-10-CM | POA: Diagnosis not present

## 2020-07-25 DIAGNOSIS — K529 Noninfective gastroenteritis and colitis, unspecified: Secondary | ICD-10-CM | POA: Diagnosis not present

## 2020-07-25 DIAGNOSIS — M549 Dorsalgia, unspecified: Secondary | ICD-10-CM | POA: Diagnosis not present

## 2020-08-06 DIAGNOSIS — Z96651 Presence of right artificial knee joint: Secondary | ICD-10-CM | POA: Diagnosis not present

## 2020-08-06 DIAGNOSIS — M1712 Unilateral primary osteoarthritis, left knee: Secondary | ICD-10-CM | POA: Diagnosis not present

## 2020-08-13 DIAGNOSIS — R262 Difficulty in walking, not elsewhere classified: Secondary | ICD-10-CM | POA: Diagnosis not present

## 2020-08-13 DIAGNOSIS — M5416 Radiculopathy, lumbar region: Secondary | ICD-10-CM | POA: Diagnosis not present

## 2020-08-13 DIAGNOSIS — M5136 Other intervertebral disc degeneration, lumbar region: Secondary | ICD-10-CM | POA: Diagnosis not present

## 2020-08-21 DIAGNOSIS — M48061 Spinal stenosis, lumbar region without neurogenic claudication: Secondary | ICD-10-CM | POA: Diagnosis not present

## 2020-08-21 DIAGNOSIS — M5416 Radiculopathy, lumbar region: Secondary | ICD-10-CM | POA: Diagnosis not present

## 2020-08-26 DIAGNOSIS — I10 Essential (primary) hypertension: Secondary | ICD-10-CM | POA: Diagnosis not present

## 2020-08-26 DIAGNOSIS — E785 Hyperlipidemia, unspecified: Secondary | ICD-10-CM | POA: Diagnosis not present

## 2020-08-26 DIAGNOSIS — E559 Vitamin D deficiency, unspecified: Secondary | ICD-10-CM | POA: Diagnosis not present

## 2020-08-26 DIAGNOSIS — Z79899 Other long term (current) drug therapy: Secondary | ICD-10-CM | POA: Diagnosis not present

## 2020-08-26 DIAGNOSIS — E039 Hypothyroidism, unspecified: Secondary | ICD-10-CM | POA: Diagnosis not present

## 2020-08-26 DIAGNOSIS — E1140 Type 2 diabetes mellitus with diabetic neuropathy, unspecified: Secondary | ICD-10-CM | POA: Diagnosis not present

## 2020-08-26 DIAGNOSIS — M1990 Unspecified osteoarthritis, unspecified site: Secondary | ICD-10-CM | POA: Diagnosis not present

## 2020-08-28 DIAGNOSIS — Z96651 Presence of right artificial knee joint: Secondary | ICD-10-CM | POA: Diagnosis not present

## 2020-08-28 DIAGNOSIS — M5136 Other intervertebral disc degeneration, lumbar region: Secondary | ICD-10-CM | POA: Diagnosis not present

## 2020-08-28 DIAGNOSIS — G8929 Other chronic pain: Secondary | ICD-10-CM | POA: Diagnosis not present

## 2020-08-28 DIAGNOSIS — Z981 Arthrodesis status: Secondary | ICD-10-CM | POA: Diagnosis not present

## 2020-08-28 DIAGNOSIS — M25562 Pain in left knee: Secondary | ICD-10-CM | POA: Diagnosis not present

## 2020-09-02 DIAGNOSIS — Z96651 Presence of right artificial knee joint: Secondary | ICD-10-CM | POA: Diagnosis not present

## 2020-09-02 DIAGNOSIS — M25461 Effusion, right knee: Secondary | ICD-10-CM | POA: Diagnosis not present

## 2020-09-02 DIAGNOSIS — G8929 Other chronic pain: Secondary | ICD-10-CM | POA: Diagnosis not present

## 2020-09-02 DIAGNOSIS — T8484XA Pain due to internal orthopedic prosthetic devices, implants and grafts, initial encounter: Secondary | ICD-10-CM | POA: Insufficient documentation

## 2020-09-02 DIAGNOSIS — M1712 Unilateral primary osteoarthritis, left knee: Secondary | ICD-10-CM | POA: Diagnosis not present

## 2020-09-02 DIAGNOSIS — M25562 Pain in left knee: Secondary | ICD-10-CM | POA: Diagnosis not present

## 2020-09-02 DIAGNOSIS — M25462 Effusion, left knee: Secondary | ICD-10-CM | POA: Diagnosis not present

## 2020-09-02 DIAGNOSIS — I708 Atherosclerosis of other arteries: Secondary | ICD-10-CM | POA: Diagnosis not present

## 2020-09-02 DIAGNOSIS — I70202 Unspecified atherosclerosis of native arteries of extremities, left leg: Secondary | ICD-10-CM | POA: Diagnosis not present

## 2020-09-02 HISTORY — DX: Pain due to internal orthopedic prosthetic devices, implants and grafts, initial encounter: T84.84XA

## 2020-09-04 DIAGNOSIS — M25561 Pain in right knee: Secondary | ICD-10-CM | POA: Diagnosis not present

## 2020-09-04 DIAGNOSIS — Z96651 Presence of right artificial knee joint: Secondary | ICD-10-CM | POA: Diagnosis not present

## 2020-09-16 DIAGNOSIS — T8484XD Pain due to internal orthopedic prosthetic devices, implants and grafts, subsequent encounter: Secondary | ICD-10-CM | POA: Diagnosis not present

## 2020-09-16 DIAGNOSIS — M1712 Unilateral primary osteoarthritis, left knee: Secondary | ICD-10-CM | POA: Diagnosis not present

## 2020-09-16 DIAGNOSIS — Z96651 Presence of right artificial knee joint: Secondary | ICD-10-CM | POA: Diagnosis not present

## 2020-09-23 DIAGNOSIS — I10 Essential (primary) hypertension: Secondary | ICD-10-CM | POA: Diagnosis not present

## 2020-09-23 DIAGNOSIS — R0602 Shortness of breath: Secondary | ICD-10-CM | POA: Diagnosis not present

## 2020-09-23 DIAGNOSIS — R109 Unspecified abdominal pain: Secondary | ICD-10-CM | POA: Diagnosis not present

## 2020-09-23 DIAGNOSIS — Z79899 Other long term (current) drug therapy: Secondary | ICD-10-CM | POA: Diagnosis not present

## 2020-09-23 DIAGNOSIS — Z01818 Encounter for other preprocedural examination: Secondary | ICD-10-CM | POA: Diagnosis not present

## 2020-09-23 DIAGNOSIS — E1140 Type 2 diabetes mellitus with diabetic neuropathy, unspecified: Secondary | ICD-10-CM | POA: Diagnosis not present

## 2020-09-23 DIAGNOSIS — G894 Chronic pain syndrome: Secondary | ICD-10-CM | POA: Diagnosis not present

## 2020-09-29 DIAGNOSIS — Z1211 Encounter for screening for malignant neoplasm of colon: Secondary | ICD-10-CM | POA: Diagnosis not present

## 2020-09-29 DIAGNOSIS — Z1212 Encounter for screening for malignant neoplasm of rectum: Secondary | ICD-10-CM | POA: Diagnosis not present

## 2020-10-07 ENCOUNTER — Other Ambulatory Visit: Payer: Self-pay

## 2020-10-07 DIAGNOSIS — E785 Hyperlipidemia, unspecified: Secondary | ICD-10-CM | POA: Insufficient documentation

## 2020-10-07 DIAGNOSIS — K219 Gastro-esophageal reflux disease without esophagitis: Secondary | ICD-10-CM | POA: Insufficient documentation

## 2020-10-07 DIAGNOSIS — E559 Vitamin D deficiency, unspecified: Secondary | ICD-10-CM | POA: Insufficient documentation

## 2020-10-07 DIAGNOSIS — E079 Disorder of thyroid, unspecified: Secondary | ICD-10-CM | POA: Insufficient documentation

## 2020-10-07 DIAGNOSIS — M5136 Other intervertebral disc degeneration, lumbar region: Secondary | ICD-10-CM | POA: Insufficient documentation

## 2020-10-07 DIAGNOSIS — E1140 Type 2 diabetes mellitus with diabetic neuropathy, unspecified: Secondary | ICD-10-CM | POA: Insufficient documentation

## 2020-10-07 DIAGNOSIS — N390 Urinary tract infection, site not specified: Secondary | ICD-10-CM | POA: Insufficient documentation

## 2020-10-07 DIAGNOSIS — Z86718 Personal history of other venous thrombosis and embolism: Secondary | ICD-10-CM | POA: Insufficient documentation

## 2020-10-07 DIAGNOSIS — G894 Chronic pain syndrome: Secondary | ICD-10-CM | POA: Insufficient documentation

## 2020-10-07 DIAGNOSIS — I10 Essential (primary) hypertension: Secondary | ICD-10-CM | POA: Insufficient documentation

## 2020-10-07 DIAGNOSIS — M329 Systemic lupus erythematosus, unspecified: Secondary | ICD-10-CM | POA: Insufficient documentation

## 2020-10-07 DIAGNOSIS — K5900 Constipation, unspecified: Secondary | ICD-10-CM | POA: Insufficient documentation

## 2020-10-07 DIAGNOSIS — M797 Fibromyalgia: Secondary | ICD-10-CM | POA: Insufficient documentation

## 2020-10-07 DIAGNOSIS — J449 Chronic obstructive pulmonary disease, unspecified: Secondary | ICD-10-CM | POA: Insufficient documentation

## 2020-10-07 DIAGNOSIS — M2550 Pain in unspecified joint: Secondary | ICD-10-CM | POA: Insufficient documentation

## 2020-10-07 DIAGNOSIS — R0602 Shortness of breath: Secondary | ICD-10-CM | POA: Insufficient documentation

## 2020-10-07 DIAGNOSIS — M069 Rheumatoid arthritis, unspecified: Secondary | ICD-10-CM | POA: Insufficient documentation

## 2020-10-07 DIAGNOSIS — M1990 Unspecified osteoarthritis, unspecified site: Secondary | ICD-10-CM | POA: Insufficient documentation

## 2020-10-08 ENCOUNTER — Other Ambulatory Visit: Payer: Self-pay

## 2020-10-08 ENCOUNTER — Ambulatory Visit (INDEPENDENT_AMBULATORY_CARE_PROVIDER_SITE_OTHER): Payer: Medicare Other | Admitting: Cardiology

## 2020-10-08 ENCOUNTER — Encounter: Payer: Self-pay | Admitting: Cardiology

## 2020-10-08 VITALS — BP 126/70 | HR 71 | Ht 62.5 in | Wt 175.2 lb

## 2020-10-08 DIAGNOSIS — I10 Essential (primary) hypertension: Secondary | ICD-10-CM | POA: Diagnosis not present

## 2020-10-08 DIAGNOSIS — E6609 Other obesity due to excess calories: Secondary | ICD-10-CM

## 2020-10-08 DIAGNOSIS — Z01810 Encounter for preprocedural cardiovascular examination: Secondary | ICD-10-CM

## 2020-10-08 DIAGNOSIS — E088 Diabetes mellitus due to underlying condition with unspecified complications: Secondary | ICD-10-CM | POA: Diagnosis not present

## 2020-10-08 DIAGNOSIS — E782 Mixed hyperlipidemia: Secondary | ICD-10-CM | POA: Insufficient documentation

## 2020-10-08 DIAGNOSIS — R011 Cardiac murmur, unspecified: Secondary | ICD-10-CM | POA: Insufficient documentation

## 2020-10-08 DIAGNOSIS — Z6832 Body mass index (BMI) 32.0-32.9, adult: Secondary | ICD-10-CM

## 2020-10-08 NOTE — Addendum Note (Signed)
Addended by: Truddie Hidden on: 10/08/2020 03:11 PM   Modules accepted: Orders

## 2020-10-08 NOTE — Progress Notes (Addendum)
Cardiology Office Note:    Date:  10/08/2020   ID:  Becky Stevens, DOB [birth year], MRN [id number]  PCP:  Nicholos Johns, MD  Cardiologist:  Jenean Lindau, MD   Referring MD: Nicholos Johns, MD    ASSESSMENT:    1. Hypertension, essential, benign   2. Preoperative cardiovascular examination   3. Mixed dyslipidemia   4. Diabetes mellitus due to underlying condition with unspecified complications (HCC)   5. Class 1 obesity due to excess calories without serious comorbidity with body mass index (BMI) of 32.0 to 32.9 in adult   6. Cardiac murmur    PLAN:    In order of problems listed above:  1. Preoperative cardiovascular assessment: Patient has multiple risk factors for coronary artery disease.  I discussed the process with the patient extensively.  And in view of this we will set her up for a Lexiscan sestamibi and he is she is agreeable.  Further recommendations will be made based on the findings of the test.  If the test is negative then she is not at high risk for coronary events during the aforementioned surgery.  Meticulous hemodynamic monitoring and continued perioperative beta-blockade will further reduce risk of coronary events. 2. Cardiac murmur: Echocardiogram will be done to assess murmur on auscultation. 3. Essential hypertension: Blood pressure stable and diet was emphasized. 4. Mixed dyslipidemia diabetes mellitus: Diet was emphasized.  Weight reduction was stressed and she promises to do so.  Patient had multiple questions which were answered to her satisfaction. 5. Patient will be seen in follow-up appointment in 6 months or earlier if the patient has any concerns  10/15/2020 Addendum: I reviewed stress test and echocardiogram report and they are unremarkable.  In view of this she is not at high risk for coronary events during the aforementioned surgery as mentioned above.  Kindly review above note.  Please do not hesitate to call us with any questions about her cardiovascular  management. Signed to Dr. Jyl Heinz  4:15 PM    Medication Adjustments/Labs and Tests Ordered: Current medicines are reviewed at length with the patient today.  Concerns regarding medicines are outlined above.  No orders of the defined types were placed in this encounter.  No orders of the defined types were placed in this encounter.    History of Present Illness:    Becky Stevens is a 79 y.o. female who is being seen today for the evaluation of preoperative cardiovascular evaluation at the request of Nicholos Johns, MD.  Patient is a pleasant 79 year old female.  She has past medical history of essential hypertension dyslipidemia and diabetes mellitus.  She is planning to undergo right knee surgery which seems to be fairly extensive.  She leads a sedentary lifestyle.  She has some shortness of breath on exertion.  For this reason she is here for preop assessment.  She denies any chest pain orthopnea or PND.  At the time of my evaluation, the patient is alert awake oriented and in no distress.  Past Medical History:  Diagnosis Date  . Arthritis   . Chronic bilateral low back pain with bilateral sciatica 02/28/2018   Last Assessment & Plan:  Patient understands she may benefit from percutaneous interventions of the lumbar spine.  Patient reports having previous lumbar spine surgery.  Last Assessment & Plan:  Formatting of this note might be different from the original. Patient has undergone multiple percutaneous interventions, most recent left-sided L3 transforaminal in February.  . Chronic pain of left knee 06/28/2019  Last Assessment & Plan:  Formatting of this note might be different from the original. Following with Orthopedics, receiving regular intra-articular injections.  Received most recent within the last several weeks.  . Chronic pain syndrome   . Class 1 obesity due to excess calories without serious comorbidity with body mass index (BMI) of 32.0 to 32.9 in adult 01/12/2019   Last  Assessment & Plan:  Patient understands any weight loss she is able to achieve only help benefit her overall health and pain issues.  Last Assessment & Plan:  Formatting of this note might be different from the original. Patient understands any weight loss she is able to achieve will only help benefit her overall health and pain issues.  . Constipation   . COPD (chronic obstructive pulmonary disease) ([place])   . DDD (degenerative disc disease), cervical 02/28/2018   Last Assessment & Plan:  Patient understands she may benefit from percutaneous interventions of the cervical spine.  Patient will continue with medications.  Last Assessment & Plan:  Formatting of this note might be different from the original. Patient understands she may benefit from percutaneous interventions of the cervical spine.  We will continue with current medications.  . DDD (degenerative disc disease), lumbar 02/28/2018   Last Assessment & Plan:  Formatting of this note might be different from the original. See laminectomy plan  . Degenerative disc disease, lumbar   . Diabetes mellitus with neuropathy ([place])   . Esophageal reflux   . Facet joint disease 02/28/2018   Last Assessment & Plan:  Patient understands she may benefit from percutaneous interventions to include medial branch blocks and possible radiofrequency ablations.  Last Assessment & Plan:  Formatting of this note might be different from the original. Patient understands she may benefit from percutaneous interventions.  Patient has had previous lumbar spine surgeries.  . Fibromyalgia   . History of blood clots   . Hyperlipidemia   . Hypertension, essential, benign   . Joint pain   . Lupus ([place])   . Muscle spasm of back 02/28/2018   Last Assessment & Plan:  Patient has Baclofen 10 mg that she uses as needed for muscle spasms.  She does not need a refill on this today.  Last Assessment & Plan:  Formatting of this note might be different from the original. Discontinue use of  all muscle relaxants due to intolerance  . Neck pain 02/28/2018   Last Assessment & Plan:  Patient understands she may benefit from percutaneous interventions of the cervical spine.  Last Assessment & Plan:  Formatting of this note might be different from the original. Patient understands she may benefit from percutaneous interventions the cervical spine.  . Neuropathy 02/28/2018   Last Assessment & Plan:  Patient will continue gabapentin 400 mg every 8 hours.  Last Assessment & Plan:  Formatting of this note might be different from the original. Continue gabapentin as prescribed by PCP  . Osteoarthritis   . Other chronic pain 02/28/2018   Last Assessment & Plan:  Patient understands she may benefit from percutaneous interventions.  We will continue with medications.  There is an opioid agreement on file within the clinic.  Last Assessment & Plan:  Formatting of this note might be different from the original. Patient understands she may benefit from percutaneous interventions.  We will continue with medications.  There is an opioid   . Pain due to total right knee replacement ([place]) 09/02/2020  . Pain medication agreement 01/12/2019   Last  Assessment & Plan:  There is a current opioid agreement on file within the clinic and urine drug screens will be collected as needed to be in compliance with clinic policies.  Last Assessment & Plan:  Formatting of this note might be different from the original. Drug screen completed today.  Review of prior drug screens are all within normal limits.  [place] controlled substance regist  . Rheumatoid arthritis ([place])   . S/P laminectomy with spinal fusion 02/28/2018   Last Assessment & Plan:  Patient reports to having previous lumbar spine surgery.  Patient understands she may benefit from percutaneous interventions of the lumbar spine.  Last Assessment & Plan:  Formatting of this note is different from the original. 2 year oldfemale who has undergone prior L4 through S1  fusion and unfortunately has since developed adjacent segment syndrome. Review ofrecent  . Shortness of breath   . Therapeutic opioid-induced constipation ([facility]) 02/28/2018   Last Assessment & Plan:  Formatting of this note might be different from the original. Continue Movantik 25 daily  . Thyroid condition   . Urinary tract infection, recurrent   . Vitamin D deficiency     Past Surgical History:  Procedure Laterality Date  . BACK SURGERY     X 3  . other     nerves clipped left knee  . PARTIAL HYSTERECTOMY    . REPLACEMENT TOTAL KNEE Right     Current Medications: Current Meds  Medication Sig  . albuterol (VENTOLIN HFA) 108 (90 Base) MCG/ACT inhaler Inhale 2 puffs into the lungs every 6 (six) hours as needed.  Marland Kitchen aspirin EC 81 MG tablet Take 81 mg by mouth daily. Swallow whole.  Marland Kitchen atorvastatin (LIPITOR) 80 MG tablet Take 80 mg by mouth at bedtime.  . diclofenac Sodium (VOLTAREN) 1 % GEL Apply 1 application topically 4 (four) times daily as needed.  . DULoxetine (CYMBALTA) 60 MG capsule Take 60 mg by mouth daily.  Marland Kitchen gabapentin (NEURONTIN) 400 MG capsule Take 400 mg by mouth 3 (three) times daily.  Marland Kitchen glimepiride (AMARYL) 2 MG tablet Take 2 mg by mouth daily.  . hydrochlorothiazide (HYDRODIURIL) 25 MG tablet Take 25 mg by mouth daily.  Marland Kitchen JANUVIA 100 MG tablet Take 100 mg by mouth daily.  Marland Kitchen levothyroxine (SYNTHROID) 75 MCG tablet Take 75 mcg by mouth daily.  . meloxicam (MOBIC) 7.5 MG tablet Take 7.5 mg by mouth daily.  . metFORMIN (GLUCOPHAGE-XR) 500 MG 24 hr tablet Take 500 mg by mouth 2 (two) times daily.  . metoprolol tartrate (LOPRESSOR) 50 MG tablet Take 50 mg by mouth 2 (two) times daily.  . montelukast (SINGULAIR) 10 MG tablet Take 10 mg by mouth daily.  . naloxone (NARCAN) nasal spray 4 mg/0.1 mL Administer 1 spray into either nostril once as needed for opiate overdose. May repeat dose in 2-3 minutes if no response from initial dose.  . nystatin cream (MYCOSTATIN) APPLY TO  THE AFFECTED AREA TO RASH UNDER BREASTS TWICE DAILY FOR 10 DAYS  . omeprazole (PRILOSEC) 20 MG capsule Take by mouth.  . oxyCODONE (OXY IR/ROXICODONE) 5 MG immediate release tablet Take 5 mg by mouth as needed for pain.  Marland Kitchen XTAMPZA ER 13.5 MG C12A Take 13.5 mg by mouth 2 (two) times daily.     Allergies:   Lisinopril, Azithromycin, Celecoxib, Morphine, Nitrofurantoin, and Quinapril   Social History   Socioeconomic History  . Marital status: Widowed    Spouse name: Not on file  . Number of children: Not  on file  . Years of education: Not on file  . Highest education level: Not on file  Occupational History  . Not on file  Tobacco Use  . Smoking status: Never Smoker  . Smokeless tobacco: Never Used  Substance and Sexual Activity  . Alcohol use: Not on file  . Drug use: Not on file  . Sexual activity: Not on file  Other Topics Concern  . Not on file  Social History Narrative  . Not on file   Social Determinants of Health   Financial Resource Strain:   . Difficulty of Paying Living Expenses: Not on file  Food Insecurity:   . Worried About Charity fundraiser in the Last Year: Not on file  . Ran Out of Food in the Last Year: Not on file  Transportation Needs:   . Lack of Transportation (Medical): Not on file  . Lack of Transportation (Non-Medical): Not on file  Physical Activity:   . Days of Exercise per Week: Not on file  . Minutes of Exercise per Session: Not on file  Stress:   . Feeling of Stress : Not on file  Social Connections:   . Frequency of Communication with Friends and Family: Not on file  . Frequency of Social Gatherings with Friends and Family: Not on file  . Attends Religious Services: Not on file  . Active Member of Clubs or Organizations: Not on file  . Attends Archivist Meetings: Not on file  . Marital Status: Not on file     Family History: The patient's family history includes Diabetes in an other family member; Heart disease in an other  family member; Hypertension in an other family member; Rheum arthritis in an other family member.  ROS:   Please see the history of present illness.    All other systems reviewed and are negative.  EKGs/Labs/Other Studies Reviewed:    The following studies were reviewed today: EKG reveals sinus rhythm and nonspecific ST-T changes   Recent Labs: No results found for requested labs within last 8760 hours.  Recent Lipid Panel No results found for: CHOL, TRIG, HDL, CHOLHDL, VLDL, LDLCALC, LDLDIRECT  Physical Exam:    VS:  BP 126/70   Pulse 71   Ht 5' 2.5" (1.588 m)   Wt 175 lb 3.2 oz (79.5 kg)   SpO2 93%   BMI 31.53 kg/m     Wt Readings from Last 3 Encounters:  10/08/20 175 lb 3.2 oz (79.5 kg)     GEN: Patient is in no acute distress HEENT: Normal NECK: No JVD; No carotid bruits LYMPHATICS: No lymphadenopathy CARDIAC: S1 S2 regular, 2/6 systolic murmur at the apex. RESPIRATORY:  Clear to auscultation without rales, wheezing or rhonchi  ABDOMEN: Soft, non-tender, non-distended MUSCULOSKELETAL:  No edema; No deformity  SKIN: Warm and dry NEUROLOGIC:  Alert and oriented x 3 PSYCHIATRIC:  Normal affect    Signed, Jenean Lindau, MD  10/08/2020 2:13 PM    [facility]

## 2020-10-08 NOTE — Patient Instructions (Signed)
Medication Instructions:  No medication changes. *If you need a refill on your cardiac medications before your next appointment, please call your pharmacy*   Lab Work: None ordered If you have labs (blood work) drawn today and your tests are completely normal, you will receive your results only by: Marland Kitchen MyChart Message (if you have MyChart) OR . A paper copy in the mail If you have any lab test that is abnormal or we need to change your treatment, we will call you to review the results.   Testing/Procedures: Your physician has requested that you have an echocardiogram. Echocardiography is a painless test that uses sound waves to create images of your heart. It provides your doctor with information about the size and shape of your heart and how well your heart's chambers and valves are working. This procedure takes approximately one hour. There are no restrictions for this procedure.  The test will take approximately 3 to 4 hours to complete; you may bring reading material.  If someone comes with you to your appointment, they will need to remain in the main lobby due to limited space in the testing area.   How to prepare for your Myocardial Perfusion Test: . Do not eat or drink 3 hours prior to your test, except you may have water. . Do not consume products containing caffeine (regular or decaffeinated) 12 hours prior to your test. (ex: coffee, chocolate, sodas, tea). . Do bring a list of your current medications with you.  If not listed below, you may take your medications as normal. . Do wear comfortable clothes (no dresses or overalls) and walking shoes, tennis shoes preferred (No heels or open toe shoes are allowed). . Do NOT wear cologne, perfume, aftershave, or lotions (deodorant is allowed). . If these instructions are not followed, your test will have to be rescheduled.  Follow-Up: At [facility], you and your health needs are our priority.  As part of our continuing mission to  provide you with exceptional heart care, we have created designated Provider Care Teams.  These Care Teams include your primary Cardiologist (physician) and Advanced Practice Providers (APPs -  Physician Assistants and Nurse Practitioners) who all work together to provide you with the care you need, when you need it.  We recommend signing up for the patient portal called "MyChart".  Sign up information is provided on this After Visit Summary.  MyChart is used to connect with patients for Virtual Visits (Telemedicine).  Patients are able to view lab/test results, encounter notes, upcoming appointments, etc.  Non-urgent messages can be sent to your provider as well.   To learn more about what you can do with MyChart, go to [email or web address].    Your next appointment:   3 month(s)  The format for your next appointment:   In Person  Provider:   Jyl Heinz, MD   Other Instructions  Cardiac Nuclear Scan A cardiac nuclear scan is a test that is done to check the flow of blood to your heart. It is done when you are resting and when you are exercising. The test looks for problems such as:  Not enough blood reaching a portion of the heart.  The heart muscle not working as it should. You may need this test if:  You have heart disease.  You have had lab results that are not normal.  You have had heart surgery or a balloon procedure to open up blocked arteries (angioplasty).  You have chest pain.  You have shortness  of breath. In this test, a special dye (tracer) is put into your bloodstream. The tracer will travel to your heart. A camera will then take pictures of your heart to see how the tracer moves through your heart. This test is usually done at a hospital and takes 2-4 hours. Tell a doctor about:  Any allergies you have.  All medicines you are taking, including vitamins, herbs, eye drops, creams, and over-the-counter medicines.  Any problems you or family members have had  with anesthetic medicines.  Any blood disorders you have.  Any surgeries you have had.  Any medical conditions you have.  Whether you are pregnant or may be pregnant. What are the risks? Generally, this is a safe test. However, problems may occur, such as:  Serious chest pain and heart attack. This is only a risk if the stress portion of the test is done.  Rapid heartbeat.  A feeling of warmth in your chest. This feeling usually does not last long.  Allergic reaction to the tracer. What happens before the test?  Ask your doctor about changing or stopping your normal medicines. This is important.  Follow instructions from your doctor about what you cannot eat or drink.  Remove your jewelry on the day of the test. What happens during the test?  An IV tube will be inserted into one of your veins.  Your doctor will give you a small amount of tracer through the IV tube.  You will wait for 20-40 minutes while the tracer moves through your bloodstream.  Your heart will be monitored with an electrocardiogram (ECG).  You will lie down on an exam table.  Pictures of your heart will be taken for about 15-20 minutes.  You may also have a stress test. For this test, one of these things may be done: ? You will be asked to exercise on a treadmill or a stationary bike. ? You will be given medicines that will make your heart work harder. This is done if you are unable to exercise.  When blood flow to your heart has peaked, a tracer will again be given through the IV tube.  After 20-40 minutes, you will get back on the exam table. More pictures will be taken of your heart.  Depending on the tracer that is used, more pictures may need to be taken 3-4 hours later.  Your IV tube will be removed when the test is over. The test may vary among doctors and hospitals. What happens after the test?  Ask your doctor: ? Whether you can return to your normal schedule, including diet,  activities, and medicines. ? Whether you should drink more fluids. This will help to remove the tracer from your body. Drink enough fluid to keep your pee (urine) pale yellow.  Ask your doctor, or the department that is doing the test: ? When will my results be ready? ? How will I get my results? Summary  A cardiac nuclear scan is a test that is done to check the flow of blood to your heart.  Tell your doctor whether you are pregnant or may be pregnant.  Before the test, ask your doctor about changing or stopping your normal medicines. This is important.  Ask your doctor whether you can return to your normal activities. You may be asked to drink more fluids. This information is not intended to replace advice given to you by your health care provider. Make sure you discuss any questions you have with your health  care provider. Document Revised: 02/28/2019 Document Reviewed: 04/24/2018 Elsevier Patient Education  [place].  Echocardiogram An echocardiogram is a procedure that uses painless sound waves (ultrasound) to produce an image of the heart. Images from an echocardiogram can provide important information about:  Signs of coronary artery disease (CAD).  Aneurysm detection. An aneurysm is a weak or damaged part of an artery wall that bulges out from the normal force of blood pumping through the body.  Heart size and shape. Changes in the size or shape of the heart can be associated with certain conditions, including heart failure, aneurysm, and CAD.  Heart muscle function.  Heart valve function.  Signs of a past heart attack.  Fluid buildup around the heart.  Thickening of the heart muscle.  A tumor or infectious growth around the heart valves. Tell a health care provider about:  Any allergies you have.  All medicines you are taking, including vitamins, herbs, eye drops, creams, and over-the-counter medicines.  Any blood disorders you have.  Any surgeries  you have had.  Any medical conditions you have.  Whether you are pregnant or may be pregnant. What are the risks? Generally, this is a safe procedure. However, problems may occur, including:  Allergic reaction to dye (contrast) that may be used during the procedure. What happens before the procedure? No specific preparation is needed. You may eat and drink normally. What happens during the procedure?   An IV tube may be inserted into one of your veins.  You may receive contrast through this tube. A contrast is an injection that improves the quality of the pictures from your heart.  A gel will be applied to your chest.  A wand-like tool (transducer) will be moved over your chest. The gel will help to transmit the sound waves from the transducer.  The sound waves will harmlessly bounce off of your heart to allow the heart images to be captured in real-time motion. The images will be recorded on a computer. The procedure may vary among health care providers and hospitals. What happens after the procedure?  You may return to your normal, everyday life, including diet, activities, and medicines, unless your health care provider tells you not to do that. Summary  An echocardiogram is a procedure that uses painless sound waves (ultrasound) to produce an image of the heart.  Images from an echocardiogram can provide important information about the size and shape of your heart, heart muscle function, heart valve function, and fluid buildup around your heart.  You do not need to do anything to prepare before this procedure. You may eat and drink normally.  After the echocardiogram is completed, you may return to your normal, everyday life, unless your health care provider tells you not to do that. This information is not intended to replace advice given to you by your health care provider. Make sure you discuss any questions you have with your health care provider. Document Revised:  03/01/2019 Document Reviewed: 12/11/2016 Elsevier Patient Education  [place].

## 2020-10-09 LAB — COLOGUARD: COLOGUARD: NEGATIVE

## 2020-10-10 ENCOUNTER — Telehealth: Payer: Self-pay | Admitting: Cardiology

## 2020-10-10 NOTE — Telephone Encounter (Signed)
Becky Stevens is calling to schedule her Echo and Lexiscan, but states she was advised by Dr. Geraldo Pitter it would be performed at the hospital instead of his office. Please advise.

## 2020-10-10 NOTE — Telephone Encounter (Signed)
Kathlee Nations with scheduling at [facility] states they rescheduled the patient's appointment time. Appointment is now scheduled for 9:00 AM (8:30 AM arrival). If additional questions, please return call to Kathlee Nations to discuss.  Phone#: [phone] (opt#: [phone])

## 2020-10-14 ENCOUNTER — Telehealth: Payer: Self-pay | Admitting: Cardiology

## 2020-10-14 DIAGNOSIS — Z01810 Encounter for preprocedural cardiovascular examination: Secondary | ICD-10-CM | POA: Diagnosis not present

## 2020-10-14 DIAGNOSIS — I340 Nonrheumatic mitral (valve) insufficiency: Secondary | ICD-10-CM | POA: Diagnosis not present

## 2020-10-14 DIAGNOSIS — Z01818 Encounter for other preprocedural examination: Secondary | ICD-10-CM | POA: Diagnosis not present

## 2020-10-14 NOTE — Telephone Encounter (Signed)
I spoke with patient. She reports she had 2 tests done at [facility] today and was told to call office for results.  I do not see results in chart. I told patient I would follow up and someone would call her back

## 2020-10-14 NOTE — Telephone Encounter (Signed)
Patient is calling for results

## 2020-10-15 NOTE — Telephone Encounter (Signed)
Patient calling back, because she has not heard anything yet. She states Dr. Bettina Gavia told her he would hand carry the notes to the office. She states she wants a call back today, because it is a holiday weekend and does not want anything "hanging over her head for Thanksgiving".

## 2020-10-15 NOTE — Telephone Encounter (Signed)
Left VM for pt to callback for questions and detailed message that she was cleared for surgery.

## 2020-11-05 DIAGNOSIS — M1712 Unilateral primary osteoarthritis, left knee: Secondary | ICD-10-CM | POA: Diagnosis not present

## 2020-11-25 DIAGNOSIS — Z79891 Long term (current) use of opiate analgesic: Secondary | ICD-10-CM | POA: Diagnosis not present

## 2020-11-25 DIAGNOSIS — G8929 Other chronic pain: Secondary | ICD-10-CM | POA: Diagnosis not present

## 2020-11-25 DIAGNOSIS — Z981 Arthrodesis status: Secondary | ICD-10-CM | POA: Diagnosis not present

## 2020-11-25 DIAGNOSIS — Z760 Encounter for issue of repeat prescription: Secondary | ICD-10-CM | POA: Diagnosis not present

## 2020-11-25 DIAGNOSIS — Z96651 Presence of right artificial knee joint: Secondary | ICD-10-CM | POA: Diagnosis not present

## 2020-11-25 DIAGNOSIS — M1712 Unilateral primary osteoarthritis, left knee: Secondary | ICD-10-CM | POA: Diagnosis not present

## 2020-11-25 DIAGNOSIS — M5136 Other intervertebral disc degeneration, lumbar region: Secondary | ICD-10-CM | POA: Diagnosis not present

## 2020-11-27 DIAGNOSIS — E119 Type 2 diabetes mellitus without complications: Secondary | ICD-10-CM | POA: Diagnosis not present

## 2020-12-10 DIAGNOSIS — H905 Unspecified sensorineural hearing loss: Secondary | ICD-10-CM | POA: Diagnosis not present

## 2021-01-06 DIAGNOSIS — E039 Hypothyroidism, unspecified: Secondary | ICD-10-CM | POA: Diagnosis not present

## 2021-01-06 DIAGNOSIS — I10 Essential (primary) hypertension: Secondary | ICD-10-CM | POA: Diagnosis not present

## 2021-01-06 DIAGNOSIS — E559 Vitamin D deficiency, unspecified: Secondary | ICD-10-CM | POA: Diagnosis not present

## 2021-01-06 DIAGNOSIS — Z8739 Personal history of other diseases of the musculoskeletal system and connective tissue: Secondary | ICD-10-CM | POA: Diagnosis not present

## 2021-01-06 DIAGNOSIS — Z780 Asymptomatic menopausal state: Secondary | ICD-10-CM | POA: Diagnosis not present

## 2021-01-06 DIAGNOSIS — M1990 Unspecified osteoarthritis, unspecified site: Secondary | ICD-10-CM | POA: Diagnosis not present

## 2021-01-06 DIAGNOSIS — E785 Hyperlipidemia, unspecified: Secondary | ICD-10-CM | POA: Diagnosis not present

## 2021-01-06 DIAGNOSIS — E1140 Type 2 diabetes mellitus with diabetic neuropathy, unspecified: Secondary | ICD-10-CM | POA: Diagnosis not present

## 2021-01-06 DIAGNOSIS — Z9181 History of falling: Secondary | ICD-10-CM | POA: Diagnosis not present

## 2021-01-06 DIAGNOSIS — Z79899 Other long term (current) drug therapy: Secondary | ICD-10-CM | POA: Diagnosis not present

## 2021-02-04 DIAGNOSIS — M1712 Unilateral primary osteoarthritis, left knee: Secondary | ICD-10-CM | POA: Diagnosis not present

## 2021-02-24 DIAGNOSIS — G8929 Other chronic pain: Secondary | ICD-10-CM | POA: Diagnosis not present

## 2021-02-24 DIAGNOSIS — Z981 Arthrodesis status: Secondary | ICD-10-CM | POA: Diagnosis not present

## 2021-02-24 DIAGNOSIS — M47819 Spondylosis without myelopathy or radiculopathy, site unspecified: Secondary | ICD-10-CM | POA: Diagnosis not present

## 2021-02-24 DIAGNOSIS — M5442 Lumbago with sciatica, left side: Secondary | ICD-10-CM | POA: Diagnosis not present

## 2021-02-24 DIAGNOSIS — Z0289 Encounter for other administrative examinations: Secondary | ICD-10-CM | POA: Diagnosis not present

## 2021-02-24 DIAGNOSIS — M5136 Other intervertebral disc degeneration, lumbar region: Secondary | ICD-10-CM | POA: Diagnosis not present

## 2021-02-24 DIAGNOSIS — M5441 Lumbago with sciatica, right side: Secondary | ICD-10-CM | POA: Diagnosis not present

## 2021-02-24 DIAGNOSIS — Z79891 Long term (current) use of opiate analgesic: Secondary | ICD-10-CM | POA: Diagnosis not present

## 2021-02-24 DIAGNOSIS — M25562 Pain in left knee: Secondary | ICD-10-CM | POA: Diagnosis not present

## 2021-02-24 DIAGNOSIS — M25561 Pain in right knee: Secondary | ICD-10-CM | POA: Diagnosis not present

## 2021-02-25 DIAGNOSIS — Z79891 Long term (current) use of opiate analgesic: Secondary | ICD-10-CM | POA: Diagnosis not present

## 2021-02-25 DIAGNOSIS — M25551 Pain in right hip: Secondary | ICD-10-CM | POA: Diagnosis not present

## 2021-02-25 DIAGNOSIS — K219 Gastro-esophageal reflux disease without esophagitis: Secondary | ICD-10-CM | POA: Diagnosis not present

## 2021-02-25 DIAGNOSIS — E039 Hypothyroidism, unspecified: Secondary | ICD-10-CM | POA: Diagnosis not present

## 2021-02-25 DIAGNOSIS — Z7982 Long term (current) use of aspirin: Secondary | ICD-10-CM | POA: Diagnosis not present

## 2021-02-25 DIAGNOSIS — D509 Iron deficiency anemia, unspecified: Secondary | ICD-10-CM | POA: Diagnosis not present

## 2021-02-25 DIAGNOSIS — Z79899 Other long term (current) drug therapy: Secondary | ICD-10-CM | POA: Diagnosis not present

## 2021-02-25 DIAGNOSIS — M797 Fibromyalgia: Secondary | ICD-10-CM | POA: Diagnosis not present

## 2021-02-25 DIAGNOSIS — S301XXA Contusion of abdominal wall, initial encounter: Secondary | ICD-10-CM | POA: Diagnosis not present

## 2021-02-25 DIAGNOSIS — E876 Hypokalemia: Secondary | ICD-10-CM | POA: Diagnosis not present

## 2021-02-25 DIAGNOSIS — Z9181 History of falling: Secondary | ICD-10-CM | POA: Diagnosis not present

## 2021-02-25 DIAGNOSIS — E871 Hypo-osmolality and hyponatremia: Secondary | ICD-10-CM | POA: Diagnosis not present

## 2021-02-25 DIAGNOSIS — E785 Hyperlipidemia, unspecified: Secondary | ICD-10-CM | POA: Diagnosis not present

## 2021-02-25 DIAGNOSIS — M1990 Unspecified osteoarthritis, unspecified site: Secondary | ICD-10-CM | POA: Diagnosis not present

## 2021-02-25 DIAGNOSIS — Z743 Need for continuous supervision: Secondary | ICD-10-CM | POA: Diagnosis not present

## 2021-02-25 DIAGNOSIS — R6889 Other general symptoms and signs: Secondary | ICD-10-CM | POA: Diagnosis not present

## 2021-02-25 DIAGNOSIS — Z7984 Long term (current) use of oral hypoglycemic drugs: Secondary | ICD-10-CM | POA: Diagnosis not present

## 2021-02-25 DIAGNOSIS — W19XXXA Unspecified fall, initial encounter: Secondary | ICD-10-CM | POA: Diagnosis not present

## 2021-02-25 DIAGNOSIS — G8929 Other chronic pain: Secondary | ICD-10-CM | POA: Diagnosis not present

## 2021-02-25 DIAGNOSIS — E1165 Type 2 diabetes mellitus with hyperglycemia: Secondary | ICD-10-CM | POA: Diagnosis not present

## 2021-02-25 DIAGNOSIS — R262 Difficulty in walking, not elsewhere classified: Secondary | ICD-10-CM | POA: Diagnosis not present

## 2021-02-25 DIAGNOSIS — M8580 Other specified disorders of bone density and structure, unspecified site: Secondary | ICD-10-CM | POA: Diagnosis not present

## 2021-02-25 DIAGNOSIS — R1031 Right lower quadrant pain: Secondary | ICD-10-CM | POA: Diagnosis not present

## 2021-02-25 DIAGNOSIS — B9689 Other specified bacterial agents as the cause of diseases classified elsewhere: Secondary | ICD-10-CM | POA: Diagnosis not present

## 2021-02-25 DIAGNOSIS — J449 Chronic obstructive pulmonary disease, unspecified: Secondary | ICD-10-CM | POA: Diagnosis not present

## 2021-02-25 DIAGNOSIS — N3001 Acute cystitis with hematuria: Secondary | ICD-10-CM | POA: Diagnosis not present

## 2021-02-25 DIAGNOSIS — S36892A Contusion of other intra-abdominal organs, initial encounter: Secondary | ICD-10-CM | POA: Diagnosis not present

## 2021-02-25 DIAGNOSIS — I10 Essential (primary) hypertension: Secondary | ICD-10-CM | POA: Diagnosis not present

## 2021-02-26 DIAGNOSIS — S36892A Contusion of other intra-abdominal organs, initial encounter: Secondary | ICD-10-CM | POA: Diagnosis not present

## 2021-02-26 DIAGNOSIS — N3001 Acute cystitis with hematuria: Secondary | ICD-10-CM | POA: Diagnosis not present

## 2021-02-26 DIAGNOSIS — E871 Hypo-osmolality and hyponatremia: Secondary | ICD-10-CM | POA: Diagnosis not present

## 2021-02-27 DIAGNOSIS — E871 Hypo-osmolality and hyponatremia: Secondary | ICD-10-CM | POA: Diagnosis not present

## 2021-02-27 DIAGNOSIS — N3001 Acute cystitis with hematuria: Secondary | ICD-10-CM | POA: Diagnosis not present

## 2021-02-27 DIAGNOSIS — S36892A Contusion of other intra-abdominal organs, initial encounter: Secondary | ICD-10-CM | POA: Diagnosis not present

## 2021-02-28 DIAGNOSIS — N3001 Acute cystitis with hematuria: Secondary | ICD-10-CM | POA: Diagnosis not present

## 2021-02-28 DIAGNOSIS — E871 Hypo-osmolality and hyponatremia: Secondary | ICD-10-CM | POA: Diagnosis not present

## 2021-02-28 DIAGNOSIS — S36892A Contusion of other intra-abdominal organs, initial encounter: Secondary | ICD-10-CM | POA: Diagnosis not present

## 2021-03-01 DIAGNOSIS — Z7984 Long term (current) use of oral hypoglycemic drugs: Secondary | ICD-10-CM | POA: Diagnosis not present

## 2021-03-01 DIAGNOSIS — I10 Essential (primary) hypertension: Secondary | ICD-10-CM | POA: Diagnosis not present

## 2021-03-01 DIAGNOSIS — M797 Fibromyalgia: Secondary | ICD-10-CM | POA: Diagnosis not present

## 2021-03-01 DIAGNOSIS — M1990 Unspecified osteoarthritis, unspecified site: Secondary | ICD-10-CM | POA: Diagnosis not present

## 2021-03-01 DIAGNOSIS — Z96651 Presence of right artificial knee joint: Secondary | ICD-10-CM | POA: Diagnosis not present

## 2021-03-01 DIAGNOSIS — E785 Hyperlipidemia, unspecified: Secondary | ICD-10-CM | POA: Diagnosis not present

## 2021-03-01 DIAGNOSIS — E876 Hypokalemia: Secondary | ICD-10-CM | POA: Diagnosis not present

## 2021-03-01 DIAGNOSIS — Z9181 History of falling: Secondary | ICD-10-CM | POA: Diagnosis not present

## 2021-03-01 DIAGNOSIS — M8589 Other specified disorders of bone density and structure, multiple sites: Secondary | ICD-10-CM | POA: Diagnosis not present

## 2021-03-01 DIAGNOSIS — E1165 Type 2 diabetes mellitus with hyperglycemia: Secondary | ICD-10-CM | POA: Diagnosis not present

## 2021-03-01 DIAGNOSIS — M545 Low back pain, unspecified: Secondary | ICD-10-CM | POA: Diagnosis not present

## 2021-03-01 DIAGNOSIS — G8929 Other chronic pain: Secondary | ICD-10-CM | POA: Diagnosis not present

## 2021-03-01 DIAGNOSIS — E039 Hypothyroidism, unspecified: Secondary | ICD-10-CM | POA: Diagnosis not present

## 2021-03-01 DIAGNOSIS — N390 Urinary tract infection, site not specified: Secondary | ICD-10-CM | POA: Diagnosis not present

## 2021-03-01 DIAGNOSIS — Z7982 Long term (current) use of aspirin: Secondary | ICD-10-CM | POA: Diagnosis not present

## 2021-03-01 DIAGNOSIS — K219 Gastro-esophageal reflux disease without esophagitis: Secondary | ICD-10-CM | POA: Diagnosis not present

## 2021-03-01 DIAGNOSIS — E871 Hypo-osmolality and hyponatremia: Secondary | ICD-10-CM | POA: Diagnosis not present

## 2021-03-01 DIAGNOSIS — R339 Retention of urine, unspecified: Secondary | ICD-10-CM | POA: Diagnosis not present

## 2021-03-01 DIAGNOSIS — N3001 Acute cystitis with hematuria: Secondary | ICD-10-CM | POA: Diagnosis not present

## 2021-03-01 DIAGNOSIS — D649 Anemia, unspecified: Secondary | ICD-10-CM | POA: Diagnosis not present

## 2021-03-01 DIAGNOSIS — J449 Chronic obstructive pulmonary disease, unspecified: Secondary | ICD-10-CM | POA: Diagnosis not present

## 2021-03-01 DIAGNOSIS — S36892D Contusion of other intra-abdominal organs, subsequent encounter: Secondary | ICD-10-CM | POA: Diagnosis not present

## 2021-03-03 DIAGNOSIS — E785 Hyperlipidemia, unspecified: Secondary | ICD-10-CM | POA: Diagnosis not present

## 2021-03-03 DIAGNOSIS — J449 Chronic obstructive pulmonary disease, unspecified: Secondary | ICD-10-CM | POA: Diagnosis not present

## 2021-03-03 DIAGNOSIS — K219 Gastro-esophageal reflux disease without esophagitis: Secondary | ICD-10-CM | POA: Diagnosis not present

## 2021-03-03 DIAGNOSIS — I10 Essential (primary) hypertension: Secondary | ICD-10-CM | POA: Diagnosis not present

## 2021-03-03 DIAGNOSIS — M545 Low back pain, unspecified: Secondary | ICD-10-CM | POA: Diagnosis not present

## 2021-03-03 DIAGNOSIS — R339 Retention of urine, unspecified: Secondary | ICD-10-CM | POA: Diagnosis not present

## 2021-03-03 DIAGNOSIS — E039 Hypothyroidism, unspecified: Secondary | ICD-10-CM | POA: Diagnosis not present

## 2021-03-03 DIAGNOSIS — Z87440 Personal history of urinary (tract) infections: Secondary | ICD-10-CM | POA: Diagnosis not present

## 2021-03-03 DIAGNOSIS — E871 Hypo-osmolality and hyponatremia: Secondary | ICD-10-CM | POA: Diagnosis not present

## 2021-03-03 DIAGNOSIS — G8929 Other chronic pain: Secondary | ICD-10-CM | POA: Diagnosis not present

## 2021-03-03 DIAGNOSIS — Z96651 Presence of right artificial knee joint: Secondary | ICD-10-CM | POA: Diagnosis not present

## 2021-03-03 DIAGNOSIS — N390 Urinary tract infection, site not specified: Secondary | ICD-10-CM | POA: Diagnosis not present

## 2021-03-03 DIAGNOSIS — E1165 Type 2 diabetes mellitus with hyperglycemia: Secondary | ICD-10-CM | POA: Diagnosis not present

## 2021-03-03 DIAGNOSIS — M797 Fibromyalgia: Secondary | ICD-10-CM | POA: Diagnosis not present

## 2021-03-03 DIAGNOSIS — Z7984 Long term (current) use of oral hypoglycemic drugs: Secondary | ICD-10-CM | POA: Diagnosis not present

## 2021-03-03 DIAGNOSIS — S301XXA Contusion of abdominal wall, initial encounter: Secondary | ICD-10-CM | POA: Diagnosis not present

## 2021-03-03 DIAGNOSIS — M8589 Other specified disorders of bone density and structure, multiple sites: Secondary | ICD-10-CM | POA: Diagnosis not present

## 2021-03-03 DIAGNOSIS — S36892D Contusion of other intra-abdominal organs, subsequent encounter: Secondary | ICD-10-CM | POA: Diagnosis not present

## 2021-03-03 DIAGNOSIS — N3001 Acute cystitis with hematuria: Secondary | ICD-10-CM | POA: Diagnosis not present

## 2021-03-03 DIAGNOSIS — D649 Anemia, unspecified: Secondary | ICD-10-CM | POA: Diagnosis not present

## 2021-03-03 DIAGNOSIS — Z09 Encounter for follow-up examination after completed treatment for conditions other than malignant neoplasm: Secondary | ICD-10-CM | POA: Diagnosis not present

## 2021-03-03 DIAGNOSIS — E876 Hypokalemia: Secondary | ICD-10-CM | POA: Diagnosis not present

## 2021-03-03 DIAGNOSIS — M1990 Unspecified osteoarthritis, unspecified site: Secondary | ICD-10-CM | POA: Diagnosis not present

## 2021-03-03 DIAGNOSIS — Z9181 History of falling: Secondary | ICD-10-CM | POA: Diagnosis not present

## 2021-03-03 DIAGNOSIS — Z7982 Long term (current) use of aspirin: Secondary | ICD-10-CM | POA: Diagnosis not present

## 2021-03-05 DIAGNOSIS — N390 Urinary tract infection, site not specified: Secondary | ICD-10-CM | POA: Diagnosis not present

## 2021-03-05 DIAGNOSIS — M1990 Unspecified osteoarthritis, unspecified site: Secondary | ICD-10-CM | POA: Diagnosis not present

## 2021-03-05 DIAGNOSIS — J449 Chronic obstructive pulmonary disease, unspecified: Secondary | ICD-10-CM | POA: Diagnosis not present

## 2021-03-05 DIAGNOSIS — Z7984 Long term (current) use of oral hypoglycemic drugs: Secondary | ICD-10-CM | POA: Diagnosis not present

## 2021-03-05 DIAGNOSIS — G8929 Other chronic pain: Secondary | ICD-10-CM | POA: Diagnosis not present

## 2021-03-05 DIAGNOSIS — E876 Hypokalemia: Secondary | ICD-10-CM | POA: Diagnosis not present

## 2021-03-05 DIAGNOSIS — D649 Anemia, unspecified: Secondary | ICD-10-CM | POA: Diagnosis not present

## 2021-03-05 DIAGNOSIS — Z9181 History of falling: Secondary | ICD-10-CM | POA: Diagnosis not present

## 2021-03-05 DIAGNOSIS — E039 Hypothyroidism, unspecified: Secondary | ICD-10-CM | POA: Diagnosis not present

## 2021-03-05 DIAGNOSIS — M797 Fibromyalgia: Secondary | ICD-10-CM | POA: Diagnosis not present

## 2021-03-05 DIAGNOSIS — E1165 Type 2 diabetes mellitus with hyperglycemia: Secondary | ICD-10-CM | POA: Diagnosis not present

## 2021-03-05 DIAGNOSIS — N3001 Acute cystitis with hematuria: Secondary | ICD-10-CM | POA: Diagnosis not present

## 2021-03-05 DIAGNOSIS — S36892D Contusion of other intra-abdominal organs, subsequent encounter: Secondary | ICD-10-CM | POA: Diagnosis not present

## 2021-03-05 DIAGNOSIS — M8589 Other specified disorders of bone density and structure, multiple sites: Secondary | ICD-10-CM | POA: Diagnosis not present

## 2021-03-05 DIAGNOSIS — M545 Low back pain, unspecified: Secondary | ICD-10-CM | POA: Diagnosis not present

## 2021-03-05 DIAGNOSIS — E871 Hypo-osmolality and hyponatremia: Secondary | ICD-10-CM | POA: Diagnosis not present

## 2021-03-05 DIAGNOSIS — K219 Gastro-esophageal reflux disease without esophagitis: Secondary | ICD-10-CM | POA: Diagnosis not present

## 2021-03-05 DIAGNOSIS — E785 Hyperlipidemia, unspecified: Secondary | ICD-10-CM | POA: Diagnosis not present

## 2021-03-05 DIAGNOSIS — R339 Retention of urine, unspecified: Secondary | ICD-10-CM | POA: Diagnosis not present

## 2021-03-05 DIAGNOSIS — Z7982 Long term (current) use of aspirin: Secondary | ICD-10-CM | POA: Diagnosis not present

## 2021-03-05 DIAGNOSIS — Z96651 Presence of right artificial knee joint: Secondary | ICD-10-CM | POA: Diagnosis not present

## 2021-03-05 DIAGNOSIS — I10 Essential (primary) hypertension: Secondary | ICD-10-CM | POA: Diagnosis not present

## 2021-03-09 DIAGNOSIS — M25551 Pain in right hip: Secondary | ICD-10-CM | POA: Diagnosis not present

## 2021-03-09 DIAGNOSIS — S36892A Contusion of other intra-abdominal organs, initial encounter: Secondary | ICD-10-CM | POA: Diagnosis not present

## 2021-03-09 DIAGNOSIS — T148XXA Other injury of unspecified body region, initial encounter: Secondary | ICD-10-CM | POA: Diagnosis not present

## 2021-03-09 DIAGNOSIS — W19XXXA Unspecified fall, initial encounter: Secondary | ICD-10-CM | POA: Diagnosis not present

## 2021-03-10 DIAGNOSIS — M8589 Other specified disorders of bone density and structure, multiple sites: Secondary | ICD-10-CM | POA: Diagnosis not present

## 2021-03-10 DIAGNOSIS — E876 Hypokalemia: Secondary | ICD-10-CM | POA: Diagnosis not present

## 2021-03-10 DIAGNOSIS — E1165 Type 2 diabetes mellitus with hyperglycemia: Secondary | ICD-10-CM | POA: Diagnosis not present

## 2021-03-10 DIAGNOSIS — N390 Urinary tract infection, site not specified: Secondary | ICD-10-CM | POA: Diagnosis not present

## 2021-03-10 DIAGNOSIS — Z7982 Long term (current) use of aspirin: Secondary | ICD-10-CM | POA: Diagnosis not present

## 2021-03-10 DIAGNOSIS — N3001 Acute cystitis with hematuria: Secondary | ICD-10-CM | POA: Diagnosis not present

## 2021-03-10 DIAGNOSIS — Z9181 History of falling: Secondary | ICD-10-CM | POA: Diagnosis not present

## 2021-03-10 DIAGNOSIS — E871 Hypo-osmolality and hyponatremia: Secondary | ICD-10-CM | POA: Diagnosis not present

## 2021-03-10 DIAGNOSIS — M797 Fibromyalgia: Secondary | ICD-10-CM | POA: Diagnosis not present

## 2021-03-10 DIAGNOSIS — E039 Hypothyroidism, unspecified: Secondary | ICD-10-CM | POA: Diagnosis not present

## 2021-03-10 DIAGNOSIS — K219 Gastro-esophageal reflux disease without esophagitis: Secondary | ICD-10-CM | POA: Diagnosis not present

## 2021-03-10 DIAGNOSIS — G8929 Other chronic pain: Secondary | ICD-10-CM | POA: Diagnosis not present

## 2021-03-10 DIAGNOSIS — I10 Essential (primary) hypertension: Secondary | ICD-10-CM | POA: Diagnosis not present

## 2021-03-10 DIAGNOSIS — Z7984 Long term (current) use of oral hypoglycemic drugs: Secondary | ICD-10-CM | POA: Diagnosis not present

## 2021-03-10 DIAGNOSIS — S36892D Contusion of other intra-abdominal organs, subsequent encounter: Secondary | ICD-10-CM | POA: Diagnosis not present

## 2021-03-10 DIAGNOSIS — Z96651 Presence of right artificial knee joint: Secondary | ICD-10-CM | POA: Diagnosis not present

## 2021-03-10 DIAGNOSIS — M1990 Unspecified osteoarthritis, unspecified site: Secondary | ICD-10-CM | POA: Diagnosis not present

## 2021-03-10 DIAGNOSIS — M545 Low back pain, unspecified: Secondary | ICD-10-CM | POA: Diagnosis not present

## 2021-03-10 DIAGNOSIS — J449 Chronic obstructive pulmonary disease, unspecified: Secondary | ICD-10-CM | POA: Diagnosis not present

## 2021-03-10 DIAGNOSIS — E785 Hyperlipidemia, unspecified: Secondary | ICD-10-CM | POA: Diagnosis not present

## 2021-03-10 DIAGNOSIS — R339 Retention of urine, unspecified: Secondary | ICD-10-CM | POA: Diagnosis not present

## 2021-03-10 DIAGNOSIS — D649 Anemia, unspecified: Secondary | ICD-10-CM | POA: Diagnosis not present

## 2021-03-12 DIAGNOSIS — N390 Urinary tract infection, site not specified: Secondary | ICD-10-CM | POA: Diagnosis not present

## 2021-03-12 DIAGNOSIS — Z7984 Long term (current) use of oral hypoglycemic drugs: Secondary | ICD-10-CM | POA: Diagnosis not present

## 2021-03-12 DIAGNOSIS — M797 Fibromyalgia: Secondary | ICD-10-CM | POA: Diagnosis not present

## 2021-03-12 DIAGNOSIS — M545 Low back pain, unspecified: Secondary | ICD-10-CM | POA: Diagnosis not present

## 2021-03-12 DIAGNOSIS — R339 Retention of urine, unspecified: Secondary | ICD-10-CM | POA: Diagnosis not present

## 2021-03-12 DIAGNOSIS — E871 Hypo-osmolality and hyponatremia: Secondary | ICD-10-CM | POA: Diagnosis not present

## 2021-03-12 DIAGNOSIS — G8929 Other chronic pain: Secondary | ICD-10-CM | POA: Diagnosis not present

## 2021-03-12 DIAGNOSIS — E1165 Type 2 diabetes mellitus with hyperglycemia: Secondary | ICD-10-CM | POA: Diagnosis not present

## 2021-03-12 DIAGNOSIS — M1990 Unspecified osteoarthritis, unspecified site: Secondary | ICD-10-CM | POA: Diagnosis not present

## 2021-03-12 DIAGNOSIS — S36892D Contusion of other intra-abdominal organs, subsequent encounter: Secondary | ICD-10-CM | POA: Diagnosis not present

## 2021-03-12 DIAGNOSIS — Z96651 Presence of right artificial knee joint: Secondary | ICD-10-CM | POA: Diagnosis not present

## 2021-03-12 DIAGNOSIS — D649 Anemia, unspecified: Secondary | ICD-10-CM | POA: Diagnosis not present

## 2021-03-12 DIAGNOSIS — Z9181 History of falling: Secondary | ICD-10-CM | POA: Diagnosis not present

## 2021-03-12 DIAGNOSIS — I10 Essential (primary) hypertension: Secondary | ICD-10-CM | POA: Diagnosis not present

## 2021-03-12 DIAGNOSIS — M8589 Other specified disorders of bone density and structure, multiple sites: Secondary | ICD-10-CM | POA: Diagnosis not present

## 2021-03-12 DIAGNOSIS — E876 Hypokalemia: Secondary | ICD-10-CM | POA: Diagnosis not present

## 2021-03-12 DIAGNOSIS — E785 Hyperlipidemia, unspecified: Secondary | ICD-10-CM | POA: Diagnosis not present

## 2021-03-12 DIAGNOSIS — Z7982 Long term (current) use of aspirin: Secondary | ICD-10-CM | POA: Diagnosis not present

## 2021-03-12 DIAGNOSIS — K219 Gastro-esophageal reflux disease without esophagitis: Secondary | ICD-10-CM | POA: Diagnosis not present

## 2021-03-12 DIAGNOSIS — E039 Hypothyroidism, unspecified: Secondary | ICD-10-CM | POA: Diagnosis not present

## 2021-03-12 DIAGNOSIS — J449 Chronic obstructive pulmonary disease, unspecified: Secondary | ICD-10-CM | POA: Diagnosis not present

## 2021-03-12 DIAGNOSIS — N3001 Acute cystitis with hematuria: Secondary | ICD-10-CM | POA: Diagnosis not present

## 2021-03-13 DIAGNOSIS — K219 Gastro-esophageal reflux disease without esophagitis: Secondary | ICD-10-CM | POA: Diagnosis not present

## 2021-03-13 DIAGNOSIS — E039 Hypothyroidism, unspecified: Secondary | ICD-10-CM | POA: Diagnosis not present

## 2021-03-13 DIAGNOSIS — D649 Anemia, unspecified: Secondary | ICD-10-CM | POA: Diagnosis not present

## 2021-03-13 DIAGNOSIS — Z96651 Presence of right artificial knee joint: Secondary | ICD-10-CM | POA: Diagnosis not present

## 2021-03-13 DIAGNOSIS — I10 Essential (primary) hypertension: Secondary | ICD-10-CM | POA: Diagnosis not present

## 2021-03-13 DIAGNOSIS — R339 Retention of urine, unspecified: Secondary | ICD-10-CM | POA: Diagnosis not present

## 2021-03-13 DIAGNOSIS — E871 Hypo-osmolality and hyponatremia: Secondary | ICD-10-CM | POA: Diagnosis not present

## 2021-03-13 DIAGNOSIS — M1990 Unspecified osteoarthritis, unspecified site: Secondary | ICD-10-CM | POA: Diagnosis not present

## 2021-03-13 DIAGNOSIS — Z7982 Long term (current) use of aspirin: Secondary | ICD-10-CM | POA: Diagnosis not present

## 2021-03-13 DIAGNOSIS — J449 Chronic obstructive pulmonary disease, unspecified: Secondary | ICD-10-CM | POA: Diagnosis not present

## 2021-03-13 DIAGNOSIS — M797 Fibromyalgia: Secondary | ICD-10-CM | POA: Diagnosis not present

## 2021-03-13 DIAGNOSIS — N390 Urinary tract infection, site not specified: Secondary | ICD-10-CM | POA: Diagnosis not present

## 2021-03-13 DIAGNOSIS — E1165 Type 2 diabetes mellitus with hyperglycemia: Secondary | ICD-10-CM | POA: Diagnosis not present

## 2021-03-13 DIAGNOSIS — Z9181 History of falling: Secondary | ICD-10-CM | POA: Diagnosis not present

## 2021-03-13 DIAGNOSIS — Z7984 Long term (current) use of oral hypoglycemic drugs: Secondary | ICD-10-CM | POA: Diagnosis not present

## 2021-03-13 DIAGNOSIS — G8929 Other chronic pain: Secondary | ICD-10-CM | POA: Diagnosis not present

## 2021-03-13 DIAGNOSIS — S36892D Contusion of other intra-abdominal organs, subsequent encounter: Secondary | ICD-10-CM | POA: Diagnosis not present

## 2021-03-13 DIAGNOSIS — M545 Low back pain, unspecified: Secondary | ICD-10-CM | POA: Diagnosis not present

## 2021-03-13 DIAGNOSIS — E876 Hypokalemia: Secondary | ICD-10-CM | POA: Diagnosis not present

## 2021-03-13 DIAGNOSIS — M8589 Other specified disorders of bone density and structure, multiple sites: Secondary | ICD-10-CM | POA: Diagnosis not present

## 2021-03-13 DIAGNOSIS — N3001 Acute cystitis with hematuria: Secondary | ICD-10-CM | POA: Diagnosis not present

## 2021-03-13 DIAGNOSIS — E785 Hyperlipidemia, unspecified: Secondary | ICD-10-CM | POA: Diagnosis not present

## 2021-03-17 DIAGNOSIS — N390 Urinary tract infection, site not specified: Secondary | ICD-10-CM | POA: Diagnosis not present

## 2021-03-17 DIAGNOSIS — E871 Hypo-osmolality and hyponatremia: Secondary | ICD-10-CM | POA: Diagnosis not present

## 2021-03-17 DIAGNOSIS — J449 Chronic obstructive pulmonary disease, unspecified: Secondary | ICD-10-CM | POA: Diagnosis not present

## 2021-03-17 DIAGNOSIS — N3001 Acute cystitis with hematuria: Secondary | ICD-10-CM | POA: Diagnosis not present

## 2021-03-17 DIAGNOSIS — M545 Low back pain, unspecified: Secondary | ICD-10-CM | POA: Diagnosis not present

## 2021-03-17 DIAGNOSIS — Z9181 History of falling: Secondary | ICD-10-CM | POA: Diagnosis not present

## 2021-03-17 DIAGNOSIS — Z7984 Long term (current) use of oral hypoglycemic drugs: Secondary | ICD-10-CM | POA: Diagnosis not present

## 2021-03-17 DIAGNOSIS — Z96651 Presence of right artificial knee joint: Secondary | ICD-10-CM | POA: Diagnosis not present

## 2021-03-17 DIAGNOSIS — D649 Anemia, unspecified: Secondary | ICD-10-CM | POA: Diagnosis not present

## 2021-03-17 DIAGNOSIS — I10 Essential (primary) hypertension: Secondary | ICD-10-CM | POA: Diagnosis not present

## 2021-03-17 DIAGNOSIS — E039 Hypothyroidism, unspecified: Secondary | ICD-10-CM | POA: Diagnosis not present

## 2021-03-17 DIAGNOSIS — S36892D Contusion of other intra-abdominal organs, subsequent encounter: Secondary | ICD-10-CM | POA: Diagnosis not present

## 2021-03-17 DIAGNOSIS — E785 Hyperlipidemia, unspecified: Secondary | ICD-10-CM | POA: Diagnosis not present

## 2021-03-17 DIAGNOSIS — R339 Retention of urine, unspecified: Secondary | ICD-10-CM | POA: Diagnosis not present

## 2021-03-17 DIAGNOSIS — M1990 Unspecified osteoarthritis, unspecified site: Secondary | ICD-10-CM | POA: Diagnosis not present

## 2021-03-17 DIAGNOSIS — E876 Hypokalemia: Secondary | ICD-10-CM | POA: Diagnosis not present

## 2021-03-17 DIAGNOSIS — K219 Gastro-esophageal reflux disease without esophagitis: Secondary | ICD-10-CM | POA: Diagnosis not present

## 2021-03-17 DIAGNOSIS — M797 Fibromyalgia: Secondary | ICD-10-CM | POA: Diagnosis not present

## 2021-03-17 DIAGNOSIS — G8929 Other chronic pain: Secondary | ICD-10-CM | POA: Diagnosis not present

## 2021-03-17 DIAGNOSIS — Z7982 Long term (current) use of aspirin: Secondary | ICD-10-CM | POA: Diagnosis not present

## 2021-03-17 DIAGNOSIS — E1165 Type 2 diabetes mellitus with hyperglycemia: Secondary | ICD-10-CM | POA: Diagnosis not present

## 2021-03-17 DIAGNOSIS — M8589 Other specified disorders of bone density and structure, multiple sites: Secondary | ICD-10-CM | POA: Diagnosis not present

## 2021-03-18 DIAGNOSIS — M1991 Primary osteoarthritis, unspecified site: Secondary | ICD-10-CM | POA: Diagnosis not present

## 2021-03-18 DIAGNOSIS — M329 Systemic lupus erythematosus, unspecified: Secondary | ICD-10-CM | POA: Diagnosis not present

## 2021-03-18 DIAGNOSIS — J449 Chronic obstructive pulmonary disease, unspecified: Secondary | ICD-10-CM | POA: Diagnosis not present

## 2021-03-19 DIAGNOSIS — Z7984 Long term (current) use of oral hypoglycemic drugs: Secondary | ICD-10-CM | POA: Diagnosis not present

## 2021-03-19 DIAGNOSIS — E876 Hypokalemia: Secondary | ICD-10-CM | POA: Diagnosis not present

## 2021-03-19 DIAGNOSIS — M797 Fibromyalgia: Secondary | ICD-10-CM | POA: Diagnosis not present

## 2021-03-19 DIAGNOSIS — R339 Retention of urine, unspecified: Secondary | ICD-10-CM | POA: Diagnosis not present

## 2021-03-19 DIAGNOSIS — J449 Chronic obstructive pulmonary disease, unspecified: Secondary | ICD-10-CM | POA: Diagnosis not present

## 2021-03-19 DIAGNOSIS — G8929 Other chronic pain: Secondary | ICD-10-CM | POA: Diagnosis not present

## 2021-03-19 DIAGNOSIS — E871 Hypo-osmolality and hyponatremia: Secondary | ICD-10-CM | POA: Diagnosis not present

## 2021-03-19 DIAGNOSIS — Z7982 Long term (current) use of aspirin: Secondary | ICD-10-CM | POA: Diagnosis not present

## 2021-03-19 DIAGNOSIS — Z9181 History of falling: Secondary | ICD-10-CM | POA: Diagnosis not present

## 2021-03-19 DIAGNOSIS — Z96651 Presence of right artificial knee joint: Secondary | ICD-10-CM | POA: Diagnosis not present

## 2021-03-19 DIAGNOSIS — K219 Gastro-esophageal reflux disease without esophagitis: Secondary | ICD-10-CM | POA: Diagnosis not present

## 2021-03-19 DIAGNOSIS — E785 Hyperlipidemia, unspecified: Secondary | ICD-10-CM | POA: Diagnosis not present

## 2021-03-19 DIAGNOSIS — M545 Low back pain, unspecified: Secondary | ICD-10-CM | POA: Diagnosis not present

## 2021-03-19 DIAGNOSIS — N390 Urinary tract infection, site not specified: Secondary | ICD-10-CM | POA: Diagnosis not present

## 2021-03-19 DIAGNOSIS — D649 Anemia, unspecified: Secondary | ICD-10-CM | POA: Diagnosis not present

## 2021-03-19 DIAGNOSIS — M1990 Unspecified osteoarthritis, unspecified site: Secondary | ICD-10-CM | POA: Diagnosis not present

## 2021-03-19 DIAGNOSIS — M8589 Other specified disorders of bone density and structure, multiple sites: Secondary | ICD-10-CM | POA: Diagnosis not present

## 2021-03-19 DIAGNOSIS — E039 Hypothyroidism, unspecified: Secondary | ICD-10-CM | POA: Diagnosis not present

## 2021-03-19 DIAGNOSIS — E1165 Type 2 diabetes mellitus with hyperglycemia: Secondary | ICD-10-CM | POA: Diagnosis not present

## 2021-03-19 DIAGNOSIS — N3001 Acute cystitis with hematuria: Secondary | ICD-10-CM | POA: Diagnosis not present

## 2021-03-19 DIAGNOSIS — S36892D Contusion of other intra-abdominal organs, subsequent encounter: Secondary | ICD-10-CM | POA: Diagnosis not present

## 2021-03-19 DIAGNOSIS — I10 Essential (primary) hypertension: Secondary | ICD-10-CM | POA: Diagnosis not present

## 2021-03-24 DIAGNOSIS — G8929 Other chronic pain: Secondary | ICD-10-CM | POA: Diagnosis not present

## 2021-03-24 DIAGNOSIS — Z9181 History of falling: Secondary | ICD-10-CM | POA: Diagnosis not present

## 2021-03-24 DIAGNOSIS — E876 Hypokalemia: Secondary | ICD-10-CM | POA: Diagnosis not present

## 2021-03-24 DIAGNOSIS — M1990 Unspecified osteoarthritis, unspecified site: Secondary | ICD-10-CM | POA: Diagnosis not present

## 2021-03-24 DIAGNOSIS — Z96651 Presence of right artificial knee joint: Secondary | ICD-10-CM | POA: Diagnosis not present

## 2021-03-24 DIAGNOSIS — R339 Retention of urine, unspecified: Secondary | ICD-10-CM | POA: Diagnosis not present

## 2021-03-24 DIAGNOSIS — E871 Hypo-osmolality and hyponatremia: Secondary | ICD-10-CM | POA: Diagnosis not present

## 2021-03-24 DIAGNOSIS — E039 Hypothyroidism, unspecified: Secondary | ICD-10-CM | POA: Diagnosis not present

## 2021-03-24 DIAGNOSIS — M8589 Other specified disorders of bone density and structure, multiple sites: Secondary | ICD-10-CM | POA: Diagnosis not present

## 2021-03-24 DIAGNOSIS — J449 Chronic obstructive pulmonary disease, unspecified: Secondary | ICD-10-CM | POA: Diagnosis not present

## 2021-03-24 DIAGNOSIS — M797 Fibromyalgia: Secondary | ICD-10-CM | POA: Diagnosis not present

## 2021-03-24 DIAGNOSIS — Z7982 Long term (current) use of aspirin: Secondary | ICD-10-CM | POA: Diagnosis not present

## 2021-03-24 DIAGNOSIS — M545 Low back pain, unspecified: Secondary | ICD-10-CM | POA: Diagnosis not present

## 2021-03-24 DIAGNOSIS — E785 Hyperlipidemia, unspecified: Secondary | ICD-10-CM | POA: Diagnosis not present

## 2021-03-24 DIAGNOSIS — D649 Anemia, unspecified: Secondary | ICD-10-CM | POA: Diagnosis not present

## 2021-03-24 DIAGNOSIS — Z7984 Long term (current) use of oral hypoglycemic drugs: Secondary | ICD-10-CM | POA: Diagnosis not present

## 2021-03-24 DIAGNOSIS — I10 Essential (primary) hypertension: Secondary | ICD-10-CM | POA: Diagnosis not present

## 2021-03-24 DIAGNOSIS — N3001 Acute cystitis with hematuria: Secondary | ICD-10-CM | POA: Diagnosis not present

## 2021-03-24 DIAGNOSIS — E1165 Type 2 diabetes mellitus with hyperglycemia: Secondary | ICD-10-CM | POA: Diagnosis not present

## 2021-03-24 DIAGNOSIS — K219 Gastro-esophageal reflux disease without esophagitis: Secondary | ICD-10-CM | POA: Diagnosis not present

## 2021-03-24 DIAGNOSIS — S36892D Contusion of other intra-abdominal organs, subsequent encounter: Secondary | ICD-10-CM | POA: Diagnosis not present

## 2021-03-26 DIAGNOSIS — E876 Hypokalemia: Secondary | ICD-10-CM | POA: Diagnosis not present

## 2021-03-26 DIAGNOSIS — N3001 Acute cystitis with hematuria: Secondary | ICD-10-CM | POA: Diagnosis not present

## 2021-03-26 DIAGNOSIS — E871 Hypo-osmolality and hyponatremia: Secondary | ICD-10-CM | POA: Diagnosis not present

## 2021-03-26 DIAGNOSIS — E785 Hyperlipidemia, unspecified: Secondary | ICD-10-CM | POA: Diagnosis not present

## 2021-03-26 DIAGNOSIS — G8929 Other chronic pain: Secondary | ICD-10-CM | POA: Diagnosis not present

## 2021-03-26 DIAGNOSIS — Z96651 Presence of right artificial knee joint: Secondary | ICD-10-CM | POA: Diagnosis not present

## 2021-03-26 DIAGNOSIS — M545 Low back pain, unspecified: Secondary | ICD-10-CM | POA: Diagnosis not present

## 2021-03-26 DIAGNOSIS — R339 Retention of urine, unspecified: Secondary | ICD-10-CM | POA: Diagnosis not present

## 2021-03-26 DIAGNOSIS — Z7984 Long term (current) use of oral hypoglycemic drugs: Secondary | ICD-10-CM | POA: Diagnosis not present

## 2021-03-26 DIAGNOSIS — M8589 Other specified disorders of bone density and structure, multiple sites: Secondary | ICD-10-CM | POA: Diagnosis not present

## 2021-03-26 DIAGNOSIS — Z7982 Long term (current) use of aspirin: Secondary | ICD-10-CM | POA: Diagnosis not present

## 2021-03-26 DIAGNOSIS — E1165 Type 2 diabetes mellitus with hyperglycemia: Secondary | ICD-10-CM | POA: Diagnosis not present

## 2021-03-26 DIAGNOSIS — Z9181 History of falling: Secondary | ICD-10-CM | POA: Diagnosis not present

## 2021-03-26 DIAGNOSIS — S36892D Contusion of other intra-abdominal organs, subsequent encounter: Secondary | ICD-10-CM | POA: Diagnosis not present

## 2021-03-26 DIAGNOSIS — D649 Anemia, unspecified: Secondary | ICD-10-CM | POA: Diagnosis not present

## 2021-03-26 DIAGNOSIS — M797 Fibromyalgia: Secondary | ICD-10-CM | POA: Diagnosis not present

## 2021-03-26 DIAGNOSIS — I10 Essential (primary) hypertension: Secondary | ICD-10-CM | POA: Diagnosis not present

## 2021-03-26 DIAGNOSIS — M1990 Unspecified osteoarthritis, unspecified site: Secondary | ICD-10-CM | POA: Diagnosis not present

## 2021-03-26 DIAGNOSIS — E039 Hypothyroidism, unspecified: Secondary | ICD-10-CM | POA: Diagnosis not present

## 2021-03-26 DIAGNOSIS — J449 Chronic obstructive pulmonary disease, unspecified: Secondary | ICD-10-CM | POA: Diagnosis not present

## 2021-03-26 DIAGNOSIS — K219 Gastro-esophageal reflux disease without esophagitis: Secondary | ICD-10-CM | POA: Diagnosis not present

## 2021-03-27 DIAGNOSIS — N3001 Acute cystitis with hematuria: Secondary | ICD-10-CM | POA: Diagnosis not present

## 2021-03-27 DIAGNOSIS — S36892D Contusion of other intra-abdominal organs, subsequent encounter: Secondary | ICD-10-CM | POA: Diagnosis not present

## 2021-03-27 DIAGNOSIS — Z7984 Long term (current) use of oral hypoglycemic drugs: Secondary | ICD-10-CM | POA: Diagnosis not present

## 2021-03-27 DIAGNOSIS — G8929 Other chronic pain: Secondary | ICD-10-CM | POA: Diagnosis not present

## 2021-03-27 DIAGNOSIS — M797 Fibromyalgia: Secondary | ICD-10-CM | POA: Diagnosis not present

## 2021-03-27 DIAGNOSIS — R339 Retention of urine, unspecified: Secondary | ICD-10-CM | POA: Diagnosis not present

## 2021-03-27 DIAGNOSIS — M8589 Other specified disorders of bone density and structure, multiple sites: Secondary | ICD-10-CM | POA: Diagnosis not present

## 2021-03-27 DIAGNOSIS — E1165 Type 2 diabetes mellitus with hyperglycemia: Secondary | ICD-10-CM | POA: Diagnosis not present

## 2021-03-27 DIAGNOSIS — E871 Hypo-osmolality and hyponatremia: Secondary | ICD-10-CM | POA: Diagnosis not present

## 2021-03-27 DIAGNOSIS — E785 Hyperlipidemia, unspecified: Secondary | ICD-10-CM | POA: Diagnosis not present

## 2021-03-27 DIAGNOSIS — I10 Essential (primary) hypertension: Secondary | ICD-10-CM | POA: Diagnosis not present

## 2021-03-27 DIAGNOSIS — E039 Hypothyroidism, unspecified: Secondary | ICD-10-CM | POA: Diagnosis not present

## 2021-03-27 DIAGNOSIS — D649 Anemia, unspecified: Secondary | ICD-10-CM | POA: Diagnosis not present

## 2021-03-27 DIAGNOSIS — K219 Gastro-esophageal reflux disease without esophagitis: Secondary | ICD-10-CM | POA: Diagnosis not present

## 2021-03-27 DIAGNOSIS — E876 Hypokalemia: Secondary | ICD-10-CM | POA: Diagnosis not present

## 2021-03-27 DIAGNOSIS — M545 Low back pain, unspecified: Secondary | ICD-10-CM | POA: Diagnosis not present

## 2021-03-27 DIAGNOSIS — Z9181 History of falling: Secondary | ICD-10-CM | POA: Diagnosis not present

## 2021-03-27 DIAGNOSIS — J449 Chronic obstructive pulmonary disease, unspecified: Secondary | ICD-10-CM | POA: Diagnosis not present

## 2021-03-27 DIAGNOSIS — Z7982 Long term (current) use of aspirin: Secondary | ICD-10-CM | POA: Diagnosis not present

## 2021-03-27 DIAGNOSIS — Z96651 Presence of right artificial knee joint: Secondary | ICD-10-CM | POA: Diagnosis not present

## 2021-03-27 DIAGNOSIS — M1990 Unspecified osteoarthritis, unspecified site: Secondary | ICD-10-CM | POA: Diagnosis not present

## 2021-03-31 DIAGNOSIS — D649 Anemia, unspecified: Secondary | ICD-10-CM | POA: Diagnosis not present

## 2021-03-31 DIAGNOSIS — Z7982 Long term (current) use of aspirin: Secondary | ICD-10-CM | POA: Diagnosis not present

## 2021-03-31 DIAGNOSIS — E871 Hypo-osmolality and hyponatremia: Secondary | ICD-10-CM | POA: Diagnosis not present

## 2021-03-31 DIAGNOSIS — E876 Hypokalemia: Secondary | ICD-10-CM | POA: Diagnosis not present

## 2021-03-31 DIAGNOSIS — R339 Retention of urine, unspecified: Secondary | ICD-10-CM | POA: Diagnosis not present

## 2021-03-31 DIAGNOSIS — Z96651 Presence of right artificial knee joint: Secondary | ICD-10-CM | POA: Diagnosis not present

## 2021-03-31 DIAGNOSIS — E1165 Type 2 diabetes mellitus with hyperglycemia: Secondary | ICD-10-CM | POA: Diagnosis not present

## 2021-03-31 DIAGNOSIS — G8929 Other chronic pain: Secondary | ICD-10-CM | POA: Diagnosis not present

## 2021-03-31 DIAGNOSIS — M8589 Other specified disorders of bone density and structure, multiple sites: Secondary | ICD-10-CM | POA: Diagnosis not present

## 2021-03-31 DIAGNOSIS — M797 Fibromyalgia: Secondary | ICD-10-CM | POA: Diagnosis not present

## 2021-03-31 DIAGNOSIS — Z7984 Long term (current) use of oral hypoglycemic drugs: Secondary | ICD-10-CM | POA: Diagnosis not present

## 2021-03-31 DIAGNOSIS — M1990 Unspecified osteoarthritis, unspecified site: Secondary | ICD-10-CM | POA: Diagnosis not present

## 2021-03-31 DIAGNOSIS — J449 Chronic obstructive pulmonary disease, unspecified: Secondary | ICD-10-CM | POA: Diagnosis not present

## 2021-03-31 DIAGNOSIS — M545 Low back pain, unspecified: Secondary | ICD-10-CM | POA: Diagnosis not present

## 2021-03-31 DIAGNOSIS — I10 Essential (primary) hypertension: Secondary | ICD-10-CM | POA: Diagnosis not present

## 2021-03-31 DIAGNOSIS — Z9181 History of falling: Secondary | ICD-10-CM | POA: Diagnosis not present

## 2021-03-31 DIAGNOSIS — S36892D Contusion of other intra-abdominal organs, subsequent encounter: Secondary | ICD-10-CM | POA: Diagnosis not present

## 2021-03-31 DIAGNOSIS — E039 Hypothyroidism, unspecified: Secondary | ICD-10-CM | POA: Diagnosis not present

## 2021-03-31 DIAGNOSIS — K219 Gastro-esophageal reflux disease without esophagitis: Secondary | ICD-10-CM | POA: Diagnosis not present

## 2021-03-31 DIAGNOSIS — E785 Hyperlipidemia, unspecified: Secondary | ICD-10-CM | POA: Diagnosis not present

## 2021-03-31 DIAGNOSIS — N3001 Acute cystitis with hematuria: Secondary | ICD-10-CM | POA: Diagnosis not present

## 2021-04-03 DIAGNOSIS — M797 Fibromyalgia: Secondary | ICD-10-CM | POA: Diagnosis not present

## 2021-04-03 DIAGNOSIS — E876 Hypokalemia: Secondary | ICD-10-CM | POA: Diagnosis not present

## 2021-04-03 DIAGNOSIS — Z7984 Long term (current) use of oral hypoglycemic drugs: Secondary | ICD-10-CM | POA: Diagnosis not present

## 2021-04-03 DIAGNOSIS — Z96651 Presence of right artificial knee joint: Secondary | ICD-10-CM | POA: Diagnosis not present

## 2021-04-03 DIAGNOSIS — S36892D Contusion of other intra-abdominal organs, subsequent encounter: Secondary | ICD-10-CM | POA: Diagnosis not present

## 2021-04-03 DIAGNOSIS — M1990 Unspecified osteoarthritis, unspecified site: Secondary | ICD-10-CM | POA: Diagnosis not present

## 2021-04-03 DIAGNOSIS — I10 Essential (primary) hypertension: Secondary | ICD-10-CM | POA: Diagnosis not present

## 2021-04-03 DIAGNOSIS — E039 Hypothyroidism, unspecified: Secondary | ICD-10-CM | POA: Diagnosis not present

## 2021-04-03 DIAGNOSIS — Z7982 Long term (current) use of aspirin: Secondary | ICD-10-CM | POA: Diagnosis not present

## 2021-04-03 DIAGNOSIS — R339 Retention of urine, unspecified: Secondary | ICD-10-CM | POA: Diagnosis not present

## 2021-04-03 DIAGNOSIS — E785 Hyperlipidemia, unspecified: Secondary | ICD-10-CM | POA: Diagnosis not present

## 2021-04-03 DIAGNOSIS — J449 Chronic obstructive pulmonary disease, unspecified: Secondary | ICD-10-CM | POA: Diagnosis not present

## 2021-04-03 DIAGNOSIS — E1165 Type 2 diabetes mellitus with hyperglycemia: Secondary | ICD-10-CM | POA: Diagnosis not present

## 2021-04-03 DIAGNOSIS — E871 Hypo-osmolality and hyponatremia: Secondary | ICD-10-CM | POA: Diagnosis not present

## 2021-04-03 DIAGNOSIS — M8589 Other specified disorders of bone density and structure, multiple sites: Secondary | ICD-10-CM | POA: Diagnosis not present

## 2021-04-03 DIAGNOSIS — K219 Gastro-esophageal reflux disease without esophagitis: Secondary | ICD-10-CM | POA: Diagnosis not present

## 2021-04-03 DIAGNOSIS — D649 Anemia, unspecified: Secondary | ICD-10-CM | POA: Diagnosis not present

## 2021-04-03 DIAGNOSIS — M545 Low back pain, unspecified: Secondary | ICD-10-CM | POA: Diagnosis not present

## 2021-04-03 DIAGNOSIS — G8929 Other chronic pain: Secondary | ICD-10-CM | POA: Diagnosis not present

## 2021-04-03 DIAGNOSIS — N3001 Acute cystitis with hematuria: Secondary | ICD-10-CM | POA: Diagnosis not present

## 2021-04-03 DIAGNOSIS — Z9181 History of falling: Secondary | ICD-10-CM | POA: Diagnosis not present

## 2021-04-07 DIAGNOSIS — K219 Gastro-esophageal reflux disease without esophagitis: Secondary | ICD-10-CM | POA: Diagnosis not present

## 2021-04-07 DIAGNOSIS — M1990 Unspecified osteoarthritis, unspecified site: Secondary | ICD-10-CM | POA: Diagnosis not present

## 2021-04-07 DIAGNOSIS — E559 Vitamin D deficiency, unspecified: Secondary | ICD-10-CM | POA: Diagnosis not present

## 2021-04-07 DIAGNOSIS — Z780 Asymptomatic menopausal state: Secondary | ICD-10-CM | POA: Diagnosis not present

## 2021-04-07 DIAGNOSIS — Z8739 Personal history of other diseases of the musculoskeletal system and connective tissue: Secondary | ICD-10-CM | POA: Diagnosis not present

## 2021-04-07 DIAGNOSIS — N343 Urethral syndrome, unspecified: Secondary | ICD-10-CM | POA: Diagnosis not present

## 2021-04-07 DIAGNOSIS — E039 Hypothyroidism, unspecified: Secondary | ICD-10-CM | POA: Diagnosis not present

## 2021-04-07 DIAGNOSIS — I10 Essential (primary) hypertension: Secondary | ICD-10-CM | POA: Diagnosis not present

## 2021-04-07 DIAGNOSIS — E1140 Type 2 diabetes mellitus with diabetic neuropathy, unspecified: Secondary | ICD-10-CM | POA: Diagnosis not present

## 2021-04-07 DIAGNOSIS — E785 Hyperlipidemia, unspecified: Secondary | ICD-10-CM | POA: Diagnosis not present

## 2021-04-08 DIAGNOSIS — N3001 Acute cystitis with hematuria: Secondary | ICD-10-CM | POA: Diagnosis not present

## 2021-04-08 DIAGNOSIS — S36892D Contusion of other intra-abdominal organs, subsequent encounter: Secondary | ICD-10-CM | POA: Diagnosis not present

## 2021-04-08 DIAGNOSIS — E1165 Type 2 diabetes mellitus with hyperglycemia: Secondary | ICD-10-CM | POA: Diagnosis not present

## 2021-04-08 DIAGNOSIS — Z7984 Long term (current) use of oral hypoglycemic drugs: Secondary | ICD-10-CM | POA: Diagnosis not present

## 2021-04-08 DIAGNOSIS — G8929 Other chronic pain: Secondary | ICD-10-CM | POA: Diagnosis not present

## 2021-04-08 DIAGNOSIS — M8589 Other specified disorders of bone density and structure, multiple sites: Secondary | ICD-10-CM | POA: Diagnosis not present

## 2021-04-08 DIAGNOSIS — Z9181 History of falling: Secondary | ICD-10-CM | POA: Diagnosis not present

## 2021-04-08 DIAGNOSIS — E039 Hypothyroidism, unspecified: Secondary | ICD-10-CM | POA: Diagnosis not present

## 2021-04-08 DIAGNOSIS — M797 Fibromyalgia: Secondary | ICD-10-CM | POA: Diagnosis not present

## 2021-04-08 DIAGNOSIS — Z7982 Long term (current) use of aspirin: Secondary | ICD-10-CM | POA: Diagnosis not present

## 2021-04-08 DIAGNOSIS — Z96651 Presence of right artificial knee joint: Secondary | ICD-10-CM | POA: Diagnosis not present

## 2021-04-08 DIAGNOSIS — R339 Retention of urine, unspecified: Secondary | ICD-10-CM | POA: Diagnosis not present

## 2021-04-08 DIAGNOSIS — M1990 Unspecified osteoarthritis, unspecified site: Secondary | ICD-10-CM | POA: Diagnosis not present

## 2021-04-08 DIAGNOSIS — K219 Gastro-esophageal reflux disease without esophagitis: Secondary | ICD-10-CM | POA: Diagnosis not present

## 2021-04-08 DIAGNOSIS — D649 Anemia, unspecified: Secondary | ICD-10-CM | POA: Diagnosis not present

## 2021-04-08 DIAGNOSIS — I10 Essential (primary) hypertension: Secondary | ICD-10-CM | POA: Diagnosis not present

## 2021-04-08 DIAGNOSIS — E876 Hypokalemia: Secondary | ICD-10-CM | POA: Diagnosis not present

## 2021-04-08 DIAGNOSIS — E871 Hypo-osmolality and hyponatremia: Secondary | ICD-10-CM | POA: Diagnosis not present

## 2021-04-08 DIAGNOSIS — M545 Low back pain, unspecified: Secondary | ICD-10-CM | POA: Diagnosis not present

## 2021-04-08 DIAGNOSIS — J449 Chronic obstructive pulmonary disease, unspecified: Secondary | ICD-10-CM | POA: Diagnosis not present

## 2021-04-08 DIAGNOSIS — E785 Hyperlipidemia, unspecified: Secondary | ICD-10-CM | POA: Diagnosis not present

## 2021-04-10 DIAGNOSIS — D649 Anemia, unspecified: Secondary | ICD-10-CM | POA: Diagnosis not present

## 2021-04-10 DIAGNOSIS — E1165 Type 2 diabetes mellitus with hyperglycemia: Secondary | ICD-10-CM | POA: Diagnosis not present

## 2021-04-10 DIAGNOSIS — E871 Hypo-osmolality and hyponatremia: Secondary | ICD-10-CM | POA: Diagnosis not present

## 2021-04-10 DIAGNOSIS — E039 Hypothyroidism, unspecified: Secondary | ICD-10-CM | POA: Diagnosis not present

## 2021-04-10 DIAGNOSIS — M797 Fibromyalgia: Secondary | ICD-10-CM | POA: Diagnosis not present

## 2021-04-10 DIAGNOSIS — Z7984 Long term (current) use of oral hypoglycemic drugs: Secondary | ICD-10-CM | POA: Diagnosis not present

## 2021-04-10 DIAGNOSIS — Z9181 History of falling: Secondary | ICD-10-CM | POA: Diagnosis not present

## 2021-04-10 DIAGNOSIS — M545 Low back pain, unspecified: Secondary | ICD-10-CM | POA: Diagnosis not present

## 2021-04-10 DIAGNOSIS — M8589 Other specified disorders of bone density and structure, multiple sites: Secondary | ICD-10-CM | POA: Diagnosis not present

## 2021-04-10 DIAGNOSIS — R339 Retention of urine, unspecified: Secondary | ICD-10-CM | POA: Diagnosis not present

## 2021-04-10 DIAGNOSIS — G8929 Other chronic pain: Secondary | ICD-10-CM | POA: Diagnosis not present

## 2021-04-10 DIAGNOSIS — E876 Hypokalemia: Secondary | ICD-10-CM | POA: Diagnosis not present

## 2021-04-10 DIAGNOSIS — E785 Hyperlipidemia, unspecified: Secondary | ICD-10-CM | POA: Diagnosis not present

## 2021-04-10 DIAGNOSIS — Z7982 Long term (current) use of aspirin: Secondary | ICD-10-CM | POA: Diagnosis not present

## 2021-04-10 DIAGNOSIS — J449 Chronic obstructive pulmonary disease, unspecified: Secondary | ICD-10-CM | POA: Diagnosis not present

## 2021-04-10 DIAGNOSIS — Z96651 Presence of right artificial knee joint: Secondary | ICD-10-CM | POA: Diagnosis not present

## 2021-04-10 DIAGNOSIS — N3001 Acute cystitis with hematuria: Secondary | ICD-10-CM | POA: Diagnosis not present

## 2021-04-10 DIAGNOSIS — K219 Gastro-esophageal reflux disease without esophagitis: Secondary | ICD-10-CM | POA: Diagnosis not present

## 2021-04-10 DIAGNOSIS — I10 Essential (primary) hypertension: Secondary | ICD-10-CM | POA: Diagnosis not present

## 2021-04-10 DIAGNOSIS — M1990 Unspecified osteoarthritis, unspecified site: Secondary | ICD-10-CM | POA: Diagnosis not present

## 2021-04-10 DIAGNOSIS — S36892D Contusion of other intra-abdominal organs, subsequent encounter: Secondary | ICD-10-CM | POA: Diagnosis not present

## 2021-04-27 DIAGNOSIS — S81812A Laceration without foreign body, left lower leg, initial encounter: Secondary | ICD-10-CM | POA: Diagnosis not present

## 2021-05-08 DIAGNOSIS — M1712 Unilateral primary osteoarthritis, left knee: Secondary | ICD-10-CM | POA: Diagnosis not present

## 2021-05-19 DIAGNOSIS — K921 Melena: Secondary | ICD-10-CM | POA: Diagnosis not present

## 2021-05-19 DIAGNOSIS — N390 Urinary tract infection, site not specified: Secondary | ICD-10-CM | POA: Diagnosis not present

## 2021-05-19 DIAGNOSIS — R3982 Chronic bladder pain: Secondary | ICD-10-CM | POA: Diagnosis not present

## 2021-05-19 DIAGNOSIS — Z79899 Other long term (current) drug therapy: Secondary | ICD-10-CM | POA: Diagnosis not present

## 2021-05-26 DIAGNOSIS — Z79891 Long term (current) use of opiate analgesic: Secondary | ICD-10-CM | POA: Diagnosis not present

## 2021-05-26 DIAGNOSIS — M1712 Unilateral primary osteoarthritis, left knee: Secondary | ICD-10-CM | POA: Diagnosis not present

## 2021-05-26 DIAGNOSIS — G8929 Other chronic pain: Secondary | ICD-10-CM | POA: Diagnosis not present

## 2021-05-26 DIAGNOSIS — Z96651 Presence of right artificial knee joint: Secondary | ICD-10-CM | POA: Diagnosis not present

## 2021-05-26 DIAGNOSIS — M961 Postlaminectomy syndrome, not elsewhere classified: Secondary | ICD-10-CM | POA: Diagnosis not present

## 2021-05-26 DIAGNOSIS — M5136 Other intervertebral disc degeneration, lumbar region: Secondary | ICD-10-CM | POA: Diagnosis not present

## 2021-06-03 DIAGNOSIS — L570 Actinic keratosis: Secondary | ICD-10-CM | POA: Diagnosis not present

## 2021-06-03 DIAGNOSIS — L97921 Non-pressure chronic ulcer of unspecified part of left lower leg limited to breakdown of skin: Secondary | ICD-10-CM | POA: Diagnosis not present

## 2021-06-16 DIAGNOSIS — R3982 Chronic bladder pain: Secondary | ICD-10-CM | POA: Diagnosis not present

## 2021-06-16 DIAGNOSIS — N390 Urinary tract infection, site not specified: Secondary | ICD-10-CM | POA: Diagnosis not present

## 2021-06-23 DIAGNOSIS — Z79891 Long term (current) use of opiate analgesic: Secondary | ICD-10-CM | POA: Diagnosis not present

## 2021-06-23 DIAGNOSIS — M25561 Pain in right knee: Secondary | ICD-10-CM | POA: Diagnosis not present

## 2021-06-23 DIAGNOSIS — M47816 Spondylosis without myelopathy or radiculopathy, lumbar region: Secondary | ICD-10-CM | POA: Diagnosis not present

## 2021-06-23 DIAGNOSIS — M25562 Pain in left knee: Secondary | ICD-10-CM | POA: Diagnosis not present

## 2021-06-23 DIAGNOSIS — Z760 Encounter for issue of repeat prescription: Secondary | ICD-10-CM | POA: Diagnosis not present

## 2021-06-23 DIAGNOSIS — G8929 Other chronic pain: Secondary | ICD-10-CM | POA: Diagnosis not present

## 2021-06-23 DIAGNOSIS — M5136 Other intervertebral disc degeneration, lumbar region: Secondary | ICD-10-CM | POA: Diagnosis not present

## 2021-06-23 DIAGNOSIS — Z5181 Encounter for therapeutic drug level monitoring: Secondary | ICD-10-CM | POA: Diagnosis not present

## 2021-06-23 DIAGNOSIS — M961 Postlaminectomy syndrome, not elsewhere classified: Secondary | ICD-10-CM | POA: Diagnosis not present

## 2021-07-09 DIAGNOSIS — E785 Hyperlipidemia, unspecified: Secondary | ICD-10-CM | POA: Diagnosis not present

## 2021-07-09 DIAGNOSIS — E039 Hypothyroidism, unspecified: Secondary | ICD-10-CM | POA: Diagnosis not present

## 2021-07-09 DIAGNOSIS — E1140 Type 2 diabetes mellitus with diabetic neuropathy, unspecified: Secondary | ICD-10-CM | POA: Diagnosis not present

## 2021-07-09 DIAGNOSIS — N343 Urethral syndrome, unspecified: Secondary | ICD-10-CM | POA: Diagnosis not present

## 2021-07-09 DIAGNOSIS — Z139 Encounter for screening, unspecified: Secondary | ICD-10-CM | POA: Diagnosis not present

## 2021-07-09 DIAGNOSIS — Z79899 Other long term (current) drug therapy: Secondary | ICD-10-CM | POA: Diagnosis not present

## 2021-07-09 DIAGNOSIS — I10 Essential (primary) hypertension: Secondary | ICD-10-CM | POA: Diagnosis not present

## 2021-07-09 DIAGNOSIS — M1990 Unspecified osteoarthritis, unspecified site: Secondary | ICD-10-CM | POA: Diagnosis not present

## 2021-07-09 DIAGNOSIS — J309 Allergic rhinitis, unspecified: Secondary | ICD-10-CM | POA: Diagnosis not present

## 2021-07-09 DIAGNOSIS — E559 Vitamin D deficiency, unspecified: Secondary | ICD-10-CM | POA: Diagnosis not present

## 2021-07-09 DIAGNOSIS — Z780 Asymptomatic menopausal state: Secondary | ICD-10-CM | POA: Diagnosis not present

## 2021-07-09 DIAGNOSIS — Z8739 Personal history of other diseases of the musculoskeletal system and connective tissue: Secondary | ICD-10-CM | POA: Diagnosis not present

## 2021-07-21 DIAGNOSIS — E119 Type 2 diabetes mellitus without complications: Secondary | ICD-10-CM | POA: Diagnosis not present

## 2021-08-12 DIAGNOSIS — Z9181 History of falling: Secondary | ICD-10-CM | POA: Diagnosis not present

## 2021-08-12 DIAGNOSIS — Z0000 Encounter for general adult medical examination without abnormal findings: Secondary | ICD-10-CM | POA: Diagnosis not present

## 2021-08-12 DIAGNOSIS — E785 Hyperlipidemia, unspecified: Secondary | ICD-10-CM | POA: Diagnosis not present

## 2021-08-18 DIAGNOSIS — N390 Urinary tract infection, site not specified: Secondary | ICD-10-CM | POA: Diagnosis not present

## 2021-08-24 DIAGNOSIS — M1712 Unilateral primary osteoarthritis, left knee: Secondary | ICD-10-CM | POA: Diagnosis not present

## 2021-09-15 DIAGNOSIS — M1712 Unilateral primary osteoarthritis, left knee: Secondary | ICD-10-CM | POA: Diagnosis not present

## 2021-09-15 DIAGNOSIS — Z96651 Presence of right artificial knee joint: Secondary | ICD-10-CM | POA: Diagnosis not present

## 2021-09-15 DIAGNOSIS — T8484XD Pain due to internal orthopedic prosthetic devices, implants and grafts, subsequent encounter: Secondary | ICD-10-CM | POA: Diagnosis not present

## 2021-09-22 DIAGNOSIS — M47816 Spondylosis without myelopathy or radiculopathy, lumbar region: Secondary | ICD-10-CM | POA: Diagnosis not present

## 2021-09-22 DIAGNOSIS — M25562 Pain in left knee: Secondary | ICD-10-CM | POA: Diagnosis not present

## 2021-09-22 DIAGNOSIS — M25561 Pain in right knee: Secondary | ICD-10-CM | POA: Diagnosis not present

## 2021-09-22 DIAGNOSIS — M5136 Other intervertebral disc degeneration, lumbar region: Secondary | ICD-10-CM | POA: Diagnosis not present

## 2021-09-22 DIAGNOSIS — Z79891 Long term (current) use of opiate analgesic: Secondary | ICD-10-CM | POA: Diagnosis not present

## 2021-09-22 DIAGNOSIS — Z5181 Encounter for therapeutic drug level monitoring: Secondary | ICD-10-CM | POA: Diagnosis not present

## 2021-09-22 DIAGNOSIS — G629 Polyneuropathy, unspecified: Secondary | ICD-10-CM | POA: Diagnosis not present

## 2021-09-22 DIAGNOSIS — G8929 Other chronic pain: Secondary | ICD-10-CM | POA: Diagnosis not present

## 2021-09-22 DIAGNOSIS — M961 Postlaminectomy syndrome, not elsewhere classified: Secondary | ICD-10-CM | POA: Diagnosis not present

## 2021-09-22 DIAGNOSIS — M47896 Other spondylosis, lumbar region: Secondary | ICD-10-CM | POA: Diagnosis not present

## 2021-09-22 DIAGNOSIS — Z760 Encounter for issue of repeat prescription: Secondary | ICD-10-CM | POA: Diagnosis not present

## 2021-09-24 DIAGNOSIS — R3982 Chronic bladder pain: Secondary | ICD-10-CM | POA: Diagnosis not present

## 2021-09-24 DIAGNOSIS — N390 Urinary tract infection, site not specified: Secondary | ICD-10-CM | POA: Diagnosis not present

## 2021-09-29 DIAGNOSIS — M7061 Trochanteric bursitis, right hip: Secondary | ICD-10-CM | POA: Diagnosis not present

## 2021-10-27 DIAGNOSIS — Z8739 Personal history of other diseases of the musculoskeletal system and connective tissue: Secondary | ICD-10-CM | POA: Diagnosis not present

## 2021-10-27 DIAGNOSIS — J309 Allergic rhinitis, unspecified: Secondary | ICD-10-CM | POA: Diagnosis not present

## 2021-10-27 DIAGNOSIS — E785 Hyperlipidemia, unspecified: Secondary | ICD-10-CM | POA: Diagnosis not present

## 2021-10-27 DIAGNOSIS — E039 Hypothyroidism, unspecified: Secondary | ICD-10-CM | POA: Diagnosis not present

## 2021-10-27 DIAGNOSIS — Z79899 Other long term (current) drug therapy: Secondary | ICD-10-CM | POA: Diagnosis not present

## 2021-10-27 DIAGNOSIS — M1990 Unspecified osteoarthritis, unspecified site: Secondary | ICD-10-CM | POA: Diagnosis not present

## 2021-10-27 DIAGNOSIS — K219 Gastro-esophageal reflux disease without esophagitis: Secondary | ICD-10-CM | POA: Diagnosis not present

## 2021-10-27 DIAGNOSIS — Z01818 Encounter for other preprocedural examination: Secondary | ICD-10-CM | POA: Diagnosis not present

## 2021-10-27 DIAGNOSIS — I10 Essential (primary) hypertension: Secondary | ICD-10-CM | POA: Diagnosis not present

## 2021-10-27 DIAGNOSIS — E559 Vitamin D deficiency, unspecified: Secondary | ICD-10-CM | POA: Diagnosis not present

## 2021-10-27 DIAGNOSIS — E1140 Type 2 diabetes mellitus with diabetic neuropathy, unspecified: Secondary | ICD-10-CM | POA: Diagnosis not present

## 2021-10-27 DIAGNOSIS — Z780 Asymptomatic menopausal state: Secondary | ICD-10-CM | POA: Diagnosis not present

## 2021-12-15 DIAGNOSIS — M1712 Unilateral primary osteoarthritis, left knee: Secondary | ICD-10-CM | POA: Diagnosis not present

## 2021-12-23 DIAGNOSIS — N390 Urinary tract infection, site not specified: Secondary | ICD-10-CM | POA: Diagnosis not present

## 2021-12-29 DIAGNOSIS — H35033 Hypertensive retinopathy, bilateral: Secondary | ICD-10-CM | POA: Diagnosis not present

## 2021-12-29 DIAGNOSIS — E119 Type 2 diabetes mellitus without complications: Secondary | ICD-10-CM | POA: Diagnosis not present

## 2021-12-29 DIAGNOSIS — H02403 Unspecified ptosis of bilateral eyelids: Secondary | ICD-10-CM | POA: Diagnosis not present

## 2021-12-29 DIAGNOSIS — H16223 Keratoconjunctivitis sicca, not specified as Sjogren's, bilateral: Secondary | ICD-10-CM | POA: Diagnosis not present

## 2022-01-13 DIAGNOSIS — E119 Type 2 diabetes mellitus without complications: Secondary | ICD-10-CM | POA: Diagnosis not present

## 2022-01-18 DIAGNOSIS — N390 Urinary tract infection, site not specified: Secondary | ICD-10-CM | POA: Diagnosis not present

## 2022-01-18 DIAGNOSIS — R3982 Chronic bladder pain: Secondary | ICD-10-CM | POA: Diagnosis not present

## 2022-01-18 DIAGNOSIS — B3731 Acute candidiasis of vulva and vagina: Secondary | ICD-10-CM | POA: Diagnosis not present

## 2022-01-19 DIAGNOSIS — H02834 Dermatochalasis of left upper eyelid: Secondary | ICD-10-CM | POA: Diagnosis not present

## 2022-01-19 DIAGNOSIS — H02403 Unspecified ptosis of bilateral eyelids: Secondary | ICD-10-CM | POA: Diagnosis not present

## 2022-01-19 DIAGNOSIS — H02831 Dermatochalasis of right upper eyelid: Secondary | ICD-10-CM | POA: Diagnosis not present

## 2022-01-19 DIAGNOSIS — Z01818 Encounter for other preprocedural examination: Secondary | ICD-10-CM | POA: Diagnosis not present

## 2022-01-20 DIAGNOSIS — E1140 Type 2 diabetes mellitus with diabetic neuropathy, unspecified: Secondary | ICD-10-CM | POA: Diagnosis not present

## 2022-01-20 DIAGNOSIS — G8929 Other chronic pain: Secondary | ICD-10-CM | POA: Diagnosis not present

## 2022-01-20 DIAGNOSIS — M25562 Pain in left knee: Secondary | ICD-10-CM | POA: Diagnosis not present

## 2022-01-20 DIAGNOSIS — H02402 Unspecified ptosis of left eyelid: Secondary | ICD-10-CM | POA: Diagnosis not present

## 2022-01-26 DIAGNOSIS — G629 Polyneuropathy, unspecified: Secondary | ICD-10-CM | POA: Diagnosis not present

## 2022-01-26 DIAGNOSIS — Z79891 Long term (current) use of opiate analgesic: Secondary | ICD-10-CM | POA: Diagnosis not present

## 2022-01-26 DIAGNOSIS — G8929 Other chronic pain: Secondary | ICD-10-CM | POA: Diagnosis not present

## 2022-01-26 DIAGNOSIS — M25561 Pain in right knee: Secondary | ICD-10-CM | POA: Diagnosis not present

## 2022-01-26 DIAGNOSIS — M25562 Pain in left knee: Secondary | ICD-10-CM | POA: Diagnosis not present

## 2022-01-26 DIAGNOSIS — M961 Postlaminectomy syndrome, not elsewhere classified: Secondary | ICD-10-CM | POA: Diagnosis not present

## 2022-01-26 DIAGNOSIS — Z760 Encounter for issue of repeat prescription: Secondary | ICD-10-CM | POA: Diagnosis not present

## 2022-01-26 DIAGNOSIS — M5136 Other intervertebral disc degeneration, lumbar region: Secondary | ICD-10-CM | POA: Diagnosis not present

## 2022-01-26 DIAGNOSIS — M62830 Muscle spasm of back: Secondary | ICD-10-CM | POA: Diagnosis not present

## 2022-03-03 DIAGNOSIS — H02834 Dermatochalasis of left upper eyelid: Secondary | ICD-10-CM | POA: Diagnosis not present

## 2022-03-03 DIAGNOSIS — H02831 Dermatochalasis of right upper eyelid: Secondary | ICD-10-CM | POA: Diagnosis not present

## 2022-03-03 DIAGNOSIS — H02403 Unspecified ptosis of bilateral eyelids: Secondary | ICD-10-CM | POA: Diagnosis not present

## 2022-03-17 DIAGNOSIS — M1712 Unilateral primary osteoarthritis, left knee: Secondary | ICD-10-CM | POA: Diagnosis not present

## 2022-03-18 DIAGNOSIS — L209 Atopic dermatitis, unspecified: Secondary | ICD-10-CM | POA: Diagnosis not present

## 2022-03-18 DIAGNOSIS — L570 Actinic keratosis: Secondary | ICD-10-CM | POA: Diagnosis not present

## 2022-03-18 DIAGNOSIS — L820 Inflamed seborrheic keratosis: Secondary | ICD-10-CM | POA: Diagnosis not present

## 2022-03-18 DIAGNOSIS — R233 Spontaneous ecchymoses: Secondary | ICD-10-CM | POA: Diagnosis not present

## 2022-04-13 DIAGNOSIS — E1140 Type 2 diabetes mellitus with diabetic neuropathy, unspecified: Secondary | ICD-10-CM | POA: Diagnosis not present

## 2022-04-21 DIAGNOSIS — M1712 Unilateral primary osteoarthritis, left knee: Secondary | ICD-10-CM | POA: Diagnosis not present

## 2022-04-27 DIAGNOSIS — M961 Postlaminectomy syndrome, not elsewhere classified: Secondary | ICD-10-CM | POA: Diagnosis not present

## 2022-04-27 DIAGNOSIS — M5136 Other intervertebral disc degeneration, lumbar region: Secondary | ICD-10-CM | POA: Diagnosis not present

## 2022-04-27 DIAGNOSIS — G629 Polyneuropathy, unspecified: Secondary | ICD-10-CM | POA: Diagnosis not present

## 2022-04-27 DIAGNOSIS — M25562 Pain in left knee: Secondary | ICD-10-CM | POA: Diagnosis not present

## 2022-04-27 DIAGNOSIS — Z5181 Encounter for therapeutic drug level monitoring: Secondary | ICD-10-CM | POA: Diagnosis not present

## 2022-04-27 DIAGNOSIS — M25561 Pain in right knee: Secondary | ICD-10-CM | POA: Diagnosis not present

## 2022-04-27 DIAGNOSIS — G8929 Other chronic pain: Secondary | ICD-10-CM | POA: Diagnosis not present

## 2022-04-29 DIAGNOSIS — E1140 Type 2 diabetes mellitus with diabetic neuropathy, unspecified: Secondary | ICD-10-CM | POA: Diagnosis not present

## 2022-05-07 DIAGNOSIS — R269 Unspecified abnormalities of gait and mobility: Secondary | ICD-10-CM | POA: Diagnosis not present

## 2022-05-07 DIAGNOSIS — K219 Gastro-esophageal reflux disease without esophagitis: Secondary | ICD-10-CM | POA: Diagnosis not present

## 2022-05-07 DIAGNOSIS — W19XXXA Unspecified fall, initial encounter: Secondary | ICD-10-CM | POA: Diagnosis not present

## 2022-05-07 DIAGNOSIS — I10 Essential (primary) hypertension: Secondary | ICD-10-CM | POA: Diagnosis not present

## 2022-05-07 DIAGNOSIS — G894 Chronic pain syndrome: Secondary | ICD-10-CM | POA: Diagnosis not present

## 2022-05-07 DIAGNOSIS — M1990 Unspecified osteoarthritis, unspecified site: Secondary | ICD-10-CM | POA: Diagnosis not present

## 2022-05-07 DIAGNOSIS — M6281 Muscle weakness (generalized): Secondary | ICD-10-CM | POA: Diagnosis not present

## 2022-05-07 DIAGNOSIS — I739 Peripheral vascular disease, unspecified: Secondary | ICD-10-CM | POA: Diagnosis not present

## 2022-05-07 DIAGNOSIS — E1140 Type 2 diabetes mellitus with diabetic neuropathy, unspecified: Secondary | ICD-10-CM | POA: Diagnosis not present

## 2022-05-10 DIAGNOSIS — N390 Urinary tract infection, site not specified: Secondary | ICD-10-CM | POA: Diagnosis not present

## 2022-05-18 DIAGNOSIS — M810 Age-related osteoporosis without current pathological fracture: Secondary | ICD-10-CM | POA: Diagnosis not present

## 2022-05-18 DIAGNOSIS — G629 Polyneuropathy, unspecified: Secondary | ICD-10-CM | POA: Diagnosis not present

## 2022-05-18 DIAGNOSIS — I10 Essential (primary) hypertension: Secondary | ICD-10-CM | POA: Diagnosis not present

## 2022-05-18 DIAGNOSIS — R269 Unspecified abnormalities of gait and mobility: Secondary | ICD-10-CM | POA: Diagnosis not present

## 2022-05-18 DIAGNOSIS — M1990 Unspecified osteoarthritis, unspecified site: Secondary | ICD-10-CM | POA: Diagnosis not present

## 2022-05-18 DIAGNOSIS — E119 Type 2 diabetes mellitus without complications: Secondary | ICD-10-CM | POA: Diagnosis not present

## 2022-06-01 DIAGNOSIS — R3982 Chronic bladder pain: Secondary | ICD-10-CM | POA: Diagnosis not present

## 2022-06-01 DIAGNOSIS — N390 Urinary tract infection, site not specified: Secondary | ICD-10-CM | POA: Diagnosis not present

## 2022-06-08 DIAGNOSIS — M5136 Other intervertebral disc degeneration, lumbar region: Secondary | ICD-10-CM | POA: Diagnosis not present

## 2022-06-08 DIAGNOSIS — Z043 Encounter for examination and observation following other accident: Secondary | ICD-10-CM | POA: Diagnosis not present

## 2022-06-08 DIAGNOSIS — W19XXXA Unspecified fall, initial encounter: Secondary | ICD-10-CM | POA: Diagnosis not present

## 2022-06-08 DIAGNOSIS — M5459 Other low back pain: Secondary | ICD-10-CM | POA: Diagnosis not present

## 2022-06-08 DIAGNOSIS — M4316 Spondylolisthesis, lumbar region: Secondary | ICD-10-CM | POA: Diagnosis not present

## 2022-06-08 DIAGNOSIS — M47816 Spondylosis without myelopathy or radiculopathy, lumbar region: Secondary | ICD-10-CM | POA: Diagnosis not present

## 2022-06-16 DIAGNOSIS — M1712 Unilateral primary osteoarthritis, left knee: Secondary | ICD-10-CM | POA: Diagnosis not present

## 2022-06-29 DIAGNOSIS — M25562 Pain in left knee: Secondary | ICD-10-CM | POA: Diagnosis not present

## 2022-06-29 DIAGNOSIS — G8929 Other chronic pain: Secondary | ICD-10-CM | POA: Diagnosis not present

## 2022-06-29 DIAGNOSIS — M961 Postlaminectomy syndrome, not elsewhere classified: Secondary | ICD-10-CM | POA: Diagnosis not present

## 2022-06-29 DIAGNOSIS — M25561 Pain in right knee: Secondary | ICD-10-CM | POA: Diagnosis not present

## 2022-06-29 DIAGNOSIS — M5136 Other intervertebral disc degeneration, lumbar region: Secondary | ICD-10-CM | POA: Diagnosis not present

## 2022-07-08 DIAGNOSIS — M5416 Radiculopathy, lumbar region: Secondary | ICD-10-CM | POA: Diagnosis not present

## 2022-07-08 DIAGNOSIS — M5136 Other intervertebral disc degeneration, lumbar region: Secondary | ICD-10-CM | POA: Diagnosis not present

## 2022-07-15 DIAGNOSIS — M5416 Radiculopathy, lumbar region: Secondary | ICD-10-CM | POA: Diagnosis not present

## 2022-07-15 DIAGNOSIS — M549 Dorsalgia, unspecified: Secondary | ICD-10-CM | POA: Diagnosis not present

## 2022-07-20 DIAGNOSIS — H905 Unspecified sensorineural hearing loss: Secondary | ICD-10-CM | POA: Diagnosis not present

## 2022-07-23 DIAGNOSIS — M5416 Radiculopathy, lumbar region: Secondary | ICD-10-CM | POA: Diagnosis not present

## 2022-07-23 DIAGNOSIS — M5136 Other intervertebral disc degeneration, lumbar region: Secondary | ICD-10-CM | POA: Diagnosis not present

## 2022-08-04 DIAGNOSIS — M25562 Pain in left knee: Secondary | ICD-10-CM | POA: Diagnosis not present

## 2022-08-04 DIAGNOSIS — M48062 Spinal stenosis, lumbar region with neurogenic claudication: Secondary | ICD-10-CM | POA: Diagnosis not present

## 2022-08-04 DIAGNOSIS — G894 Chronic pain syndrome: Secondary | ICD-10-CM | POA: Diagnosis not present

## 2022-08-04 DIAGNOSIS — M25561 Pain in right knee: Secondary | ICD-10-CM | POA: Diagnosis not present

## 2022-08-04 DIAGNOSIS — M47816 Spondylosis without myelopathy or radiculopathy, lumbar region: Secondary | ICD-10-CM | POA: Diagnosis not present

## 2022-08-04 DIAGNOSIS — M961 Postlaminectomy syndrome, not elsewhere classified: Secondary | ICD-10-CM | POA: Diagnosis not present

## 2022-08-26 DIAGNOSIS — M5416 Radiculopathy, lumbar region: Secondary | ICD-10-CM | POA: Diagnosis not present

## 2022-08-27 DIAGNOSIS — I119 Hypertensive heart disease without heart failure: Secondary | ICD-10-CM | POA: Diagnosis not present

## 2022-08-27 DIAGNOSIS — M1990 Unspecified osteoarthritis, unspecified site: Secondary | ICD-10-CM | POA: Diagnosis not present

## 2022-08-27 DIAGNOSIS — G894 Chronic pain syndrome: Secondary | ICD-10-CM | POA: Diagnosis not present

## 2022-08-27 DIAGNOSIS — M545 Low back pain, unspecified: Secondary | ICD-10-CM | POA: Diagnosis not present

## 2022-08-27 DIAGNOSIS — M6281 Muscle weakness (generalized): Secondary | ICD-10-CM | POA: Diagnosis not present

## 2022-08-27 DIAGNOSIS — M810 Age-related osteoporosis without current pathological fracture: Secondary | ICD-10-CM | POA: Diagnosis not present

## 2022-08-27 DIAGNOSIS — E1140 Type 2 diabetes mellitus with diabetic neuropathy, unspecified: Secondary | ICD-10-CM | POA: Diagnosis not present

## 2022-08-27 DIAGNOSIS — N390 Urinary tract infection, site not specified: Secondary | ICD-10-CM | POA: Diagnosis not present

## 2022-08-27 DIAGNOSIS — K219 Gastro-esophageal reflux disease without esophagitis: Secondary | ICD-10-CM | POA: Diagnosis not present

## 2022-08-27 DIAGNOSIS — R269 Unspecified abnormalities of gait and mobility: Secondary | ICD-10-CM | POA: Diagnosis not present

## 2022-09-16 DIAGNOSIS — M47816 Spondylosis without myelopathy or radiculopathy, lumbar region: Secondary | ICD-10-CM | POA: Diagnosis not present

## 2022-09-23 DIAGNOSIS — M1712 Unilateral primary osteoarthritis, left knee: Secondary | ICD-10-CM | POA: Diagnosis not present

## 2022-09-29 DIAGNOSIS — M5136 Other intervertebral disc degeneration, lumbar region: Secondary | ICD-10-CM | POA: Diagnosis not present

## 2022-09-29 DIAGNOSIS — M25561 Pain in right knee: Secondary | ICD-10-CM | POA: Diagnosis not present

## 2022-09-29 DIAGNOSIS — G8929 Other chronic pain: Secondary | ICD-10-CM | POA: Diagnosis not present

## 2022-09-29 DIAGNOSIS — Z5181 Encounter for therapeutic drug level monitoring: Secondary | ICD-10-CM | POA: Diagnosis not present

## 2022-09-29 DIAGNOSIS — M25562 Pain in left knee: Secondary | ICD-10-CM | POA: Diagnosis not present

## 2022-09-29 DIAGNOSIS — M961 Postlaminectomy syndrome, not elsewhere classified: Secondary | ICD-10-CM | POA: Diagnosis not present

## 2022-10-07 DIAGNOSIS — R3982 Chronic bladder pain: Secondary | ICD-10-CM | POA: Diagnosis not present

## 2022-10-07 DIAGNOSIS — N390 Urinary tract infection, site not specified: Secondary | ICD-10-CM | POA: Diagnosis not present

## 2022-11-10 ENCOUNTER — Other Ambulatory Visit: Payer: Self-pay | Admitting: Internal Medicine

## 2022-11-10 DIAGNOSIS — E039 Hypothyroidism, unspecified: Secondary | ICD-10-CM | POA: Diagnosis not present

## 2022-11-10 DIAGNOSIS — E119 Type 2 diabetes mellitus without complications: Secondary | ICD-10-CM | POA: Diagnosis not present

## 2022-11-10 DIAGNOSIS — I119 Hypertensive heart disease without heart failure: Secondary | ICD-10-CM | POA: Diagnosis not present

## 2022-11-11 LAB — CBC WITH DIFFERENTIAL/PLATELET
Basophils Absolute: 0 10*3/uL (ref 0.0–0.2)
Basos: 1 %
EOS (ABSOLUTE): 0.1 10*3/uL (ref 0.0–0.4)
Eos: 1 %
Hematocrit: 32.9 % — ABNORMAL LOW (ref 34.0–46.6)
Hemoglobin: 10.5 g/dL — ABNORMAL LOW (ref 11.1–15.9)
Immature Grans (Abs): 0 10*3/uL (ref 0.0–0.1)
Immature Granulocytes: 0 %
Lymphocytes Absolute: 1.4 10*3/uL (ref 0.7–3.1)
Lymphs: 21 %
MCH: 26.6 pg (ref 26.6–33.0)
MCHC: 31.9 g/dL (ref 31.5–35.7)
MCV: 84 fL (ref 79–97)
Monocytes Absolute: 0.4 10*3/uL (ref 0.1–0.9)
Monocytes: 7 %
Neutrophils Absolute: 4.6 10*3/uL (ref 1.4–7.0)
Neutrophils: 70 %
Platelets: 299 10*3/uL (ref 150–450)
RBC: 3.94 x10E6/uL (ref 3.77–5.28)
RDW: 15.8 % — ABNORMAL HIGH (ref 11.7–15.4)
WBC: 6.5 10*3/uL (ref 3.4–10.8)

## 2022-11-11 LAB — TSH: TSH: 3.35 u[IU]/mL (ref 0.450–4.500)

## 2022-11-11 LAB — BASIC METABOLIC PANEL
BUN/Creatinine Ratio: 11 — ABNORMAL LOW (ref 12–28)
BUN: 9 mg/dL (ref 8–27)
CO2: 28 mmol/L (ref 20–29)
Calcium: 9.4 mg/dL (ref 8.7–10.3)
Chloride: 98 mmol/L (ref 96–106)
Creatinine, Ser: 0.84 mg/dL (ref 0.57–1.00)
Glucose: 111 mg/dL — ABNORMAL HIGH (ref 70–99)
Potassium: 4.1 mmol/L (ref 3.5–5.2)
Sodium: 140 mmol/L (ref 134–144)
eGFR: 69 mL/min/{1.73_m2} (ref >59)

## 2022-11-11 LAB — B12 AND FOLATE PANEL
Folate: 6.8 ng/mL (ref >3.0)
Vitamin B-12: 319 pg/mL (ref 232–1245)

## 2022-11-11 LAB — TRIGLYCERIDES: Triglycerides: 217 mg/dL — ABNORMAL HIGH (ref 0–149)

## 2022-11-11 LAB — HGB A1C W/O EAG: Hgb A1c MFr Bld: 7.2 % — ABNORMAL HIGH (ref 4.8–5.6)

## 2022-11-12 ENCOUNTER — Other Ambulatory Visit: Payer: Self-pay

## 2022-11-12 MED ORDER — GLUCOSE BLOOD VI STRP: ORAL_STRIP | 12 refills | Status: DC

## 2022-11-16 ENCOUNTER — Other Ambulatory Visit: Payer: Self-pay

## 2022-11-16 MED ORDER — JANUVIA 100 MG PO TABS: 100.0000 mg | ORAL_TABLET | ORAL | 0 refills | Status: DC

## 2022-11-16 MED ORDER — METOPROLOL TARTRATE 50 MG PO TABS: 50.0000 mg | ORAL_TABLET | Freq: Two times a day (BID) | ORAL | 0 refills | Status: DC

## 2022-11-16 MED ORDER — LUBIPROSTONE 24 MCG PO CAPS: 24.0000 ug | ORAL_CAPSULE | Freq: Two times a day (BID) | ORAL | 0 refills | Status: DC

## 2022-12-08 ENCOUNTER — Ambulatory Visit (INDEPENDENT_AMBULATORY_CARE_PROVIDER_SITE_OTHER): Payer: 59 | Admitting: Podiatry

## 2022-12-08 ENCOUNTER — Encounter: Payer: Self-pay | Admitting: Podiatry

## 2022-12-08 DIAGNOSIS — M79674 Pain in right toe(s): Secondary | ICD-10-CM

## 2022-12-08 DIAGNOSIS — E0840 Diabetes mellitus due to underlying condition with diabetic neuropathy, unspecified: Secondary | ICD-10-CM

## 2022-12-08 DIAGNOSIS — B351 Tinea unguium: Secondary | ICD-10-CM | POA: Diagnosis not present

## 2022-12-08 DIAGNOSIS — M79675 Pain in left toe(s): Secondary | ICD-10-CM | POA: Diagnosis not present

## 2022-12-08 DIAGNOSIS — S91115A Laceration without foreign body of left lesser toe(s) without damage to nail, initial encounter: Secondary | ICD-10-CM

## 2022-12-08 NOTE — Progress Notes (Signed)
Subjective:  Patient ID: Becky Stevens, female    DOB: [birth year],   MRN: [id number]  Chief Complaint  Patient presents with   Nail Problem    Diabetic Foot Care    Toe Injury    Patient dropped a can of fruit on her 4th toe on left foot, open area.     82 y.o. female presents for concern of thickened elongated and painful nails that are difficult to trim. Requesting to have them trimmed today. Relates burning and tingling in their feet. Patient is diabetic and last A1c was  Lab Results  Component Value Date   HGBA1C 7.2 (H) 11/10/2022   .   Does relates a couple hours prior to appointment she dropped a can of fruit on her left fourth toe. She has put some neosporin on it but hoping to check out.   PCP:  Nicholos Johns, MD    . Denies any other pedal complaints. Denies n/v/f/c.   Past Medical History:  Diagnosis Date   Arthritis    Chronic bilateral low back pain with bilateral sciatica 02/28/2018   Last Assessment & Plan:  Patient understands she may benefit from percutaneous interventions of the lumbar spine.  Patient reports having previous lumbar spine surgery.  Last Assessment & Plan:  Formatting of this note might be different from the original. Patient has undergone multiple percutaneous interventions, most recent left-sided L3 transforaminal in February.   Chronic pain of left knee 06/28/2019   Last Assessment & Plan:  Formatting of this note might be different from the original. Following with Orthopedics, receiving regular intra-articular injections.  Received most recent within the last several weeks.   Chronic pain syndrome    Class 1 obesity due to excess calories without serious comorbidity with body mass index (BMI) of 32.0 to 32.9 in adult 01/12/2019   Last Assessment & Plan:  Patient understands any weight loss she is able to achieve only help benefit her overall health and pain issues.  Last Assessment & Plan:  Formatting of this note might be different from the original.  Patient understands any weight loss she is able to achieve will only help benefit her overall health and pain issues.   Constipation    COPD (chronic obstructive pulmonary disease) (HCC)    DDD (degenerative disc disease), cervical 02/28/2018   Last Assessment & Plan:  Patient understands she may benefit from percutaneous interventions of the cervical spine.  Patient will continue with medications.  Last Assessment & Plan:  Formatting of this note might be different from the original. Patient understands she may benefit from percutaneous interventions of the cervical spine.  We will continue with current medications.   DDD (degenerative disc disease), lumbar 02/28/2018   Last Assessment & Plan:  Formatting of this note might be different from the original. See laminectomy plan   Degenerative disc disease, lumbar    Diabetes mellitus with neuropathy ([place])    Esophageal reflux    Facet joint disease 02/28/2018   Last Assessment & Plan:  Patient understands she may benefit from percutaneous interventions to include medial branch blocks and possible radiofrequency ablations.  Last Assessment & Plan:  Formatting of this note might be different from the original. Patient understands she may benefit from percutaneous interventions.  Patient has had previous lumbar spine surgeries.   Fibromyalgia    History of blood clots    Hyperlipidemia    Hypertension, essential, benign    Joint pain    Lupus ([place])  Muscle spasm of back 02/28/2018   Last Assessment & Plan:  Patient has Baclofen 10 mg that she uses as needed for muscle spasms.  She does not need a refill on this today.  Last Assessment & Plan:  Formatting of this note might be different from the original. Discontinue use of all muscle relaxants due to intolerance   Neck pain 02/28/2018   Last Assessment & Plan:  Patient understands she may benefit from percutaneous interventions of the cervical spine.  Last Assessment & Plan:  Formatting of this note might  be different from the original. Patient understands she may benefit from percutaneous interventions the cervical spine.   Neuropathy 02/28/2018   Last Assessment & Plan:  Patient will continue gabapentin 400 mg every 8 hours.  Last Assessment & Plan:  Formatting of this note might be different from the original. Continue gabapentin as prescribed by PCP   Osteoarthritis    Other chronic pain 02/28/2018   Last Assessment & Plan:  Patient understands she may benefit from percutaneous interventions.  We will continue with medications.  There is an opioid agreement on file within the clinic.  Last Assessment & Plan:  Formatting of this note might be different from the original. Patient understands she may benefit from percutaneous interventions.  We will continue with medications.  There is an opioid    Pain due to total right knee replacement ([place]) 09/02/2020   Pain medication agreement 01/12/2019   Last Assessment & Plan:  There is a current opioid agreement on file within the clinic and urine drug screens will be collected as needed to be in compliance with clinic policies.  Last Assessment & Plan:  Formatting of this note might be different from the original. Drug screen completed today.  Review of prior drug screens are all within normal limits.  [place] controlled substance regist   Rheumatoid arthritis ([place])    S/P laminectomy with spinal fusion 02/28/2018   Last Assessment & Plan:  Patient reports to having previous lumbar spine surgery.  Patient understands she may benefit from percutaneous interventions of the lumbar spine.  Last Assessment & Plan:  Formatting of this note is different from the original. 82 year old female who has undergone prior L4 through S1 fusion and unfortunately has since developed adjacent segment syndrome.  Review of recent   Shortness of breath    Therapeutic opioid-induced constipation (OIC) 02/28/2018   Last Assessment & Plan:  Formatting of this note might be different  from the original. Continue Movantik 25 daily   Thyroid condition    Urinary tract infection, recurrent    Vitamin D deficiency     Objective:  Physical Exam: Vascular: DP/PT pulses 2/4 bilateral. CFT <3 seconds. Absent hair growth on digits. Edema noted to bilateral lower extremities. Xerosis noted bilaterally.  Skin. No lacerations or abrasions bilateral feet. Nails 1-5 bilateral  are thickened discolored and elongated with subungual debris.  Left fourth digit with split laceration noted to lateral border mild bleeding noted.  Musculoskeletal: MMT 5/5 bilateral lower extremities in DF, PF, Inversion and Eversion. Deceased ROM in DF of ankle joint.  Neurological: Sensation intact to light touch. Protective sensation diminished bilateral.    Assessment:   1. Pain due to onychomycosis of toenails of both feet   2. Diabetes mellitus due to underlying condition with diabetic neuropathy, unspecified whether long term insulin use ([place])   3. Laceration of lesser toe of left foot without foreign body present or damage to nail, initial  encounter      Plan:  Patient was evaluated and treated and all questions answered. -Discussed and educated patient on diabetic foot care, especially with  regards to the vascular, neurological and musculoskeletal systems.  -Stressed the importance of good glycemic control and the detriment of not  controlling glucose levels in relation to the foot. -Discussed supportive shoes at all times and checking feet regularly.  -Mechanically debrided all nails 1-5 bilateral using sterile nail nipper and filed with dremel without incident  -Laceration to left fourth toe cleaned and dressed with neosporin and band aid Advised to keep clean and bandaid over until healed. Call if she has any concerns -Answered all patient questions -Patient to return  in 3 months for at risk foot care -Patient advised to call the office if any problems or questions arise in the  meantime.   Lorenda Peck, DPM

## 2022-12-20 NOTE — Progress Notes (Signed)
Patient called.  Patient aware.  Her labs show her A1c is acceptable.  Her TG level was a bit elevated and she is slightly anemic.  I want her to eat healthy.  We will follow her blood counts.

## 2022-12-22 ENCOUNTER — Other Ambulatory Visit: Payer: Self-pay

## 2022-12-22 DIAGNOSIS — G894 Chronic pain syndrome: Secondary | ICD-10-CM | POA: Diagnosis not present

## 2022-12-22 DIAGNOSIS — M810 Age-related osteoporosis without current pathological fracture: Secondary | ICD-10-CM | POA: Diagnosis not present

## 2022-12-22 DIAGNOSIS — E1140 Type 2 diabetes mellitus with diabetic neuropathy, unspecified: Secondary | ICD-10-CM | POA: Diagnosis not present

## 2022-12-22 DIAGNOSIS — I119 Hypertensive heart disease without heart failure: Secondary | ICD-10-CM | POA: Diagnosis not present

## 2022-12-22 DIAGNOSIS — M545 Low back pain, unspecified: Secondary | ICD-10-CM | POA: Diagnosis not present

## 2022-12-22 DIAGNOSIS — E785 Hyperlipidemia, unspecified: Secondary | ICD-10-CM | POA: Diagnosis not present

## 2022-12-22 DIAGNOSIS — E039 Hypothyroidism, unspecified: Secondary | ICD-10-CM | POA: Diagnosis not present

## 2022-12-22 DIAGNOSIS — M1990 Unspecified osteoarthritis, unspecified site: Secondary | ICD-10-CM | POA: Diagnosis not present

## 2022-12-22 DIAGNOSIS — M1712 Unilateral primary osteoarthritis, left knee: Secondary | ICD-10-CM | POA: Diagnosis not present

## 2022-12-22 MED ORDER — SEMAGLUTIDE(0.25 OR 0.5MG/DOS) 2 MG/3ML ~~LOC~~ SOPN: 0.2500 mg | PEN_INJECTOR | SUBCUTANEOUS | 3 refills | Status: DC

## 2022-12-24 DIAGNOSIS — M1712 Unilateral primary osteoarthritis, left knee: Secondary | ICD-10-CM | POA: Diagnosis not present

## 2022-12-29 DIAGNOSIS — M25562 Pain in left knee: Secondary | ICD-10-CM | POA: Diagnosis not present

## 2022-12-29 DIAGNOSIS — Z79891 Long term (current) use of opiate analgesic: Secondary | ICD-10-CM | POA: Diagnosis not present

## 2022-12-29 DIAGNOSIS — M961 Postlaminectomy syndrome, not elsewhere classified: Secondary | ICD-10-CM | POA: Diagnosis not present

## 2022-12-29 DIAGNOSIS — G8929 Other chronic pain: Secondary | ICD-10-CM | POA: Diagnosis not present

## 2022-12-29 DIAGNOSIS — M5136 Other intervertebral disc degeneration, lumbar region: Secondary | ICD-10-CM | POA: Diagnosis not present

## 2022-12-29 DIAGNOSIS — Z79899 Other long term (current) drug therapy: Secondary | ICD-10-CM | POA: Diagnosis not present

## 2022-12-29 DIAGNOSIS — M25561 Pain in right knee: Secondary | ICD-10-CM | POA: Diagnosis not present

## 2022-12-29 DIAGNOSIS — G629 Polyneuropathy, unspecified: Secondary | ICD-10-CM | POA: Diagnosis not present

## 2023-01-04 ENCOUNTER — Other Ambulatory Visit: Payer: Self-pay | Admitting: Internal Medicine

## 2023-01-04 ENCOUNTER — Other Ambulatory Visit: Payer: Self-pay

## 2023-01-04 DIAGNOSIS — I10 Essential (primary) hypertension: Secondary | ICD-10-CM

## 2023-01-04 DIAGNOSIS — N390 Urinary tract infection, site not specified: Secondary | ICD-10-CM | POA: Diagnosis not present

## 2023-01-18 DIAGNOSIS — N390 Urinary tract infection, site not specified: Secondary | ICD-10-CM | POA: Diagnosis not present

## 2023-01-18 DIAGNOSIS — R3982 Chronic bladder pain: Secondary | ICD-10-CM | POA: Diagnosis not present

## 2023-02-19 ENCOUNTER — Other Ambulatory Visit: Payer: Self-pay | Admitting: Internal Medicine

## 2023-03-09 ENCOUNTER — Ambulatory Visit (INDEPENDENT_AMBULATORY_CARE_PROVIDER_SITE_OTHER): Payer: 59 | Admitting: Podiatry

## 2023-03-09 ENCOUNTER — Encounter: Payer: Self-pay | Admitting: Podiatry

## 2023-03-09 DIAGNOSIS — B351 Tinea unguium: Secondary | ICD-10-CM

## 2023-03-09 DIAGNOSIS — M79675 Pain in left toe(s): Secondary | ICD-10-CM | POA: Diagnosis not present

## 2023-03-09 DIAGNOSIS — M79674 Pain in right toe(s): Secondary | ICD-10-CM

## 2023-03-09 DIAGNOSIS — E0840 Diabetes mellitus due to underlying condition with diabetic neuropathy, unspecified: Secondary | ICD-10-CM

## 2023-03-09 NOTE — Progress Notes (Signed)
Subjective:  Patient ID: Becky Stevens, female    DOB: 1941-02-09,   MRN: [id number]  Chief Complaint  Patient presents with   diabetic foot care    82 y.o. female presents for concern of thickened elongated and painful nails that are difficult to trim. Requesting to have them trimmed today. Relates burning and tingling in their feet. Patient is diabetic and last A1c was  Lab Results  Component Value Date   HGBA1C 7.2 (H) 11/10/2022   .     PCP:  Eloisa Northern, MD    . Denies any other pedal complaints. Denies n/v/f/c.   Past Medical History:  Diagnosis Date   Arthritis    Chronic bilateral low back pain with bilateral sciatica 02/28/2018   Last Assessment & Plan:  Patient understands she may benefit from percutaneous interventions of the lumbar spine.  Patient reports having previous lumbar spine surgery.  Last Assessment & Plan:  Formatting of this note might be different from the original. Patient has undergone multiple percutaneous interventions, most recent left-sided L3 transforaminal in February.   Chronic pain of left knee 06/28/2019   Last Assessment & Plan:  Formatting of this note might be different from the original. Following with Orthopedics, receiving regular intra-articular injections.  Received most recent within the last several weeks.   Chronic pain syndrome    Class 1 obesity due to excess calories without serious comorbidity with body mass index (BMI) of 32.0 to 32.9 in adult 01/12/2019   Last Assessment & Plan:  Patient understands any weight loss she is able to achieve only help benefit her overall health and pain issues.  Last Assessment & Plan:  Formatting of this note might be different from the original. Patient understands any weight loss she is able to achieve will only help benefit her overall health and pain issues.   Constipation    COPD (chronic obstructive pulmonary disease)    DDD (degenerative disc disease), cervical 02/28/2018   Last Assessment & Plan:   Patient understands she may benefit from percutaneous interventions of the cervical spine.  Patient will continue with medications.  Last Assessment & Plan:  Formatting of this note might be different from the original. Patient understands she may benefit from percutaneous interventions of the cervical spine.  We will continue with current medications.   DDD (degenerative disc disease), lumbar 02/28/2018   Last Assessment & Plan:  Formatting of this note might be different from the original. See laminectomy plan   Degenerative disc disease, lumbar    Diabetes mellitus with neuropathy    Esophageal reflux    Facet joint disease 02/28/2018   Last Assessment & Plan:  Patient understands she may benefit from percutaneous interventions to include medial branch blocks and possible radiofrequency ablations.  Last Assessment & Plan:  Formatting of this note might be different from the original. Patient understands she may benefit from percutaneous interventions.  Patient has had previous lumbar spine surgeries.   Fibromyalgia    History of blood clots    Hyperlipidemia    Hypertension, essential, benign    Joint pain    Lupus    Muscle spasm of back 02/28/2018   Last Assessment & Plan:  Patient has Baclofen 10 mg that she uses as needed for muscle spasms.  She does not need a refill on this today.  Last Assessment & Plan:  Formatting of this note might be different from the original. Discontinue use of all muscle relaxants due to intolerance  Neck pain 02/28/2018   Last Assessment & Plan:  Patient understands she may benefit from percutaneous interventions of the cervical spine.  Last Assessment & Plan:  Formatting of this note might be different from the original. Patient understands she may benefit from percutaneous interventions the cervical spine.   Neuropathy 02/28/2018   Last Assessment & Plan:  Patient will continue gabapentin 400 mg every 8 hours.  Last Assessment & Plan:  Formatting of this note might be  different from the original. Continue gabapentin as prescribed by PCP   Osteoarthritis    Other chronic pain 02/28/2018   Last Assessment & Plan:  Patient understands she may benefit from percutaneous interventions.  We will continue with medications.  There is an opioid agreement on file within the clinic.  Last Assessment & Plan:  Formatting of this note might be different from the original. Patient understands she may benefit from percutaneous interventions.  We will continue with medications.  There is an opioid    Pain due to total right knee replacement 09/02/2020   Pain medication agreement 01/12/2019   Last Assessment & Plan:  There is a current opioid agreement on file within the clinic and urine drug screens will be collected as needed to be in compliance with clinic policies.  Last Assessment & Plan:  Formatting of this note might be different from the original. Drug screen completed today.  Review of prior drug screens are all within normal limits.  [place] controlled substance regist   Rheumatoid arthritis    S/P laminectomy with spinal fusion 02/28/2018   Last Assessment & Plan:  Patient reports to having previous lumbar spine surgery.  Patient understands she may benefit from percutaneous interventions of the lumbar spine.  Last Assessment & Plan:  Formatting of this note is different from the original. 82 year old female who has undergone prior L4 through S1 fusion and unfortunately has since developed adjacent segment syndrome.  Review of recent   Shortness of breath    Therapeutic opioid-induced constipation (OIC) 02/28/2018   Last Assessment & Plan:  Formatting of this note might be different from the original. Continue Movantik 25 daily   Thyroid condition    Urinary tract infection, recurrent    Vitamin D deficiency     Objective:  Physical Exam: Vascular: DP/PT pulses 2/4 bilateral. CFT <3 seconds. Absent hair growth on digits. Edema noted to bilateral lower extremities.  Xerosis noted bilaterally.  Skin. No lacerations or abrasions bilateral feet. Nails 1-5 bilateral  are thickened discolored and elongated with subungual debris.  Left fourth digit with split laceration noted to lateral border mild bleeding noted.  Musculoskeletal: MMT 5/5 bilateral lower extremities in DF, PF, Inversion and Eversion. Deceased ROM in DF of ankle joint.  Neurological: Sensation intact to light touch. Protective sensation diminished bilateral.    Assessment:   1. Pain due to onychomycosis of toenails of both feet   2. Diabetes mellitus due to underlying condition with diabetic neuropathy, unspecified whether long term insulin use       Plan:  Patient was evaluated and treated and all questions answered. -Discussed and educated patient on diabetic foot care, especially with  regards to the vascular, neurological and musculoskeletal systems.  -Stressed the importance of good glycemic control and the detriment of not  controlling glucose levels in relation to the foot. -Discussed supportive shoes at all times and checking feet regularly.  -Mechanically debrided all nails 1-5 bilateral using sterile nail nipper and filed with dremel without  incident  -Answered all patient questions -Patient to return  in 3 months for at risk foot care -Patient advised to call the office if any problems or questions arise in the meantime.   Louann Sjogren, DPM

## 2023-03-15 DIAGNOSIS — M1712 Unilateral primary osteoarthritis, left knee: Secondary | ICD-10-CM | POA: Diagnosis not present

## 2023-03-24 DIAGNOSIS — M79609 Pain in unspecified limb: Secondary | ICD-10-CM | POA: Diagnosis not present

## 2023-03-24 DIAGNOSIS — S2231XA Fracture of one rib, right side, initial encounter for closed fracture: Secondary | ICD-10-CM | POA: Diagnosis not present

## 2023-03-24 DIAGNOSIS — R9431 Abnormal electrocardiogram [ECG] [EKG]: Secondary | ICD-10-CM | POA: Diagnosis not present

## 2023-03-24 DIAGNOSIS — Z01818 Encounter for other preprocedural examination: Secondary | ICD-10-CM | POA: Diagnosis not present

## 2023-03-24 DIAGNOSIS — E559 Vitamin D deficiency, unspecified: Secondary | ICD-10-CM | POA: Diagnosis not present

## 2023-03-24 DIAGNOSIS — Z79899 Other long term (current) drug therapy: Secondary | ICD-10-CM | POA: Diagnosis not present

## 2023-03-29 DIAGNOSIS — G8929 Other chronic pain: Secondary | ICD-10-CM | POA: Diagnosis not present

## 2023-03-29 DIAGNOSIS — Z79891 Long term (current) use of opiate analgesic: Secondary | ICD-10-CM | POA: Diagnosis not present

## 2023-03-29 DIAGNOSIS — G629 Polyneuropathy, unspecified: Secondary | ICD-10-CM | POA: Diagnosis not present

## 2023-03-29 DIAGNOSIS — M25561 Pain in right knee: Secondary | ICD-10-CM | POA: Diagnosis not present

## 2023-03-29 DIAGNOSIS — M25562 Pain in left knee: Secondary | ICD-10-CM | POA: Diagnosis not present

## 2023-03-29 DIAGNOSIS — M961 Postlaminectomy syndrome, not elsewhere classified: Secondary | ICD-10-CM | POA: Diagnosis not present

## 2023-04-12 DIAGNOSIS — N390 Urinary tract infection, site not specified: Secondary | ICD-10-CM | POA: Diagnosis not present

## 2023-04-20 DIAGNOSIS — N390 Urinary tract infection, site not specified: Secondary | ICD-10-CM | POA: Diagnosis not present

## 2023-04-21 DIAGNOSIS — E038 Other specified hypothyroidism: Secondary | ICD-10-CM | POA: Diagnosis not present

## 2023-04-21 DIAGNOSIS — K219 Gastro-esophageal reflux disease without esophagitis: Secondary | ICD-10-CM | POA: Diagnosis not present

## 2023-04-21 DIAGNOSIS — M1712 Unilateral primary osteoarthritis, left knee: Secondary | ICD-10-CM | POA: Diagnosis not present

## 2023-04-21 DIAGNOSIS — Z7984 Long term (current) use of oral hypoglycemic drugs: Secondary | ICD-10-CM | POA: Diagnosis not present

## 2023-04-21 DIAGNOSIS — Z96652 Presence of left artificial knee joint: Secondary | ICD-10-CM | POA: Diagnosis not present

## 2023-04-21 DIAGNOSIS — E785 Hyperlipidemia, unspecified: Secondary | ICD-10-CM | POA: Diagnosis not present

## 2023-04-21 DIAGNOSIS — Z471 Aftercare following joint replacement surgery: Secondary | ICD-10-CM | POA: Diagnosis not present

## 2023-04-21 DIAGNOSIS — G8918 Other acute postprocedural pain: Secondary | ICD-10-CM | POA: Diagnosis not present

## 2023-04-21 DIAGNOSIS — I10 Essential (primary) hypertension: Secondary | ICD-10-CM | POA: Diagnosis not present

## 2023-04-21 DIAGNOSIS — E119 Type 2 diabetes mellitus without complications: Secondary | ICD-10-CM | POA: Diagnosis not present

## 2023-04-21 DIAGNOSIS — R2689 Other abnormalities of gait and mobility: Secondary | ICD-10-CM | POA: Diagnosis not present

## 2023-04-22 DIAGNOSIS — M1712 Unilateral primary osteoarthritis, left knee: Secondary | ICD-10-CM | POA: Diagnosis not present

## 2023-04-22 DIAGNOSIS — E785 Hyperlipidemia, unspecified: Secondary | ICD-10-CM | POA: Diagnosis not present

## 2023-04-22 DIAGNOSIS — I10 Essential (primary) hypertension: Secondary | ICD-10-CM | POA: Diagnosis not present

## 2023-04-22 DIAGNOSIS — Z7984 Long term (current) use of oral hypoglycemic drugs: Secondary | ICD-10-CM | POA: Diagnosis not present

## 2023-04-22 DIAGNOSIS — E038 Other specified hypothyroidism: Secondary | ICD-10-CM | POA: Diagnosis not present

## 2023-04-22 DIAGNOSIS — K219 Gastro-esophageal reflux disease without esophagitis: Secondary | ICD-10-CM | POA: Diagnosis not present

## 2023-04-22 DIAGNOSIS — R2689 Other abnormalities of gait and mobility: Secondary | ICD-10-CM | POA: Diagnosis not present

## 2023-04-22 DIAGNOSIS — G8918 Other acute postprocedural pain: Secondary | ICD-10-CM | POA: Diagnosis not present

## 2023-04-22 DIAGNOSIS — E119 Type 2 diabetes mellitus without complications: Secondary | ICD-10-CM | POA: Diagnosis not present

## 2023-04-23 DIAGNOSIS — E119 Type 2 diabetes mellitus without complications: Secondary | ICD-10-CM | POA: Diagnosis not present

## 2023-04-23 DIAGNOSIS — G8918 Other acute postprocedural pain: Secondary | ICD-10-CM | POA: Diagnosis not present

## 2023-04-23 DIAGNOSIS — E785 Hyperlipidemia, unspecified: Secondary | ICD-10-CM | POA: Diagnosis not present

## 2023-04-23 DIAGNOSIS — K219 Gastro-esophageal reflux disease without esophagitis: Secondary | ICD-10-CM | POA: Diagnosis not present

## 2023-04-23 DIAGNOSIS — E038 Other specified hypothyroidism: Secondary | ICD-10-CM | POA: Diagnosis not present

## 2023-04-23 DIAGNOSIS — M1712 Unilateral primary osteoarthritis, left knee: Secondary | ICD-10-CM | POA: Diagnosis not present

## 2023-04-23 DIAGNOSIS — I10 Essential (primary) hypertension: Secondary | ICD-10-CM | POA: Diagnosis not present

## 2023-04-23 DIAGNOSIS — Z7984 Long term (current) use of oral hypoglycemic drugs: Secondary | ICD-10-CM | POA: Diagnosis not present

## 2023-04-23 DIAGNOSIS — R2689 Other abnormalities of gait and mobility: Secondary | ICD-10-CM | POA: Diagnosis not present

## 2023-04-24 DIAGNOSIS — K219 Gastro-esophageal reflux disease without esophagitis: Secondary | ICD-10-CM | POA: Diagnosis not present

## 2023-04-24 DIAGNOSIS — Z7984 Long term (current) use of oral hypoglycemic drugs: Secondary | ICD-10-CM | POA: Diagnosis not present

## 2023-04-24 DIAGNOSIS — I10 Essential (primary) hypertension: Secondary | ICD-10-CM | POA: Diagnosis not present

## 2023-04-24 DIAGNOSIS — M1712 Unilateral primary osteoarthritis, left knee: Secondary | ICD-10-CM | POA: Diagnosis not present

## 2023-04-24 DIAGNOSIS — E785 Hyperlipidemia, unspecified: Secondary | ICD-10-CM | POA: Diagnosis not present

## 2023-04-24 DIAGNOSIS — E119 Type 2 diabetes mellitus without complications: Secondary | ICD-10-CM | POA: Diagnosis not present

## 2023-04-24 DIAGNOSIS — G8918 Other acute postprocedural pain: Secondary | ICD-10-CM | POA: Diagnosis not present

## 2023-04-24 DIAGNOSIS — E038 Other specified hypothyroidism: Secondary | ICD-10-CM | POA: Diagnosis not present

## 2023-04-24 DIAGNOSIS — R2689 Other abnormalities of gait and mobility: Secondary | ICD-10-CM | POA: Diagnosis not present

## 2023-04-25 DIAGNOSIS — G8918 Other acute postprocedural pain: Secondary | ICD-10-CM | POA: Diagnosis not present

## 2023-04-25 DIAGNOSIS — M1712 Unilateral primary osteoarthritis, left knee: Secondary | ICD-10-CM | POA: Diagnosis not present

## 2023-04-25 DIAGNOSIS — E785 Hyperlipidemia, unspecified: Secondary | ICD-10-CM | POA: Diagnosis not present

## 2023-04-25 DIAGNOSIS — I10 Essential (primary) hypertension: Secondary | ICD-10-CM | POA: Diagnosis not present

## 2023-04-25 DIAGNOSIS — R2689 Other abnormalities of gait and mobility: Secondary | ICD-10-CM | POA: Diagnosis not present

## 2023-04-25 DIAGNOSIS — E119 Type 2 diabetes mellitus without complications: Secondary | ICD-10-CM | POA: Diagnosis not present

## 2023-04-25 DIAGNOSIS — Z7984 Long term (current) use of oral hypoglycemic drugs: Secondary | ICD-10-CM | POA: Diagnosis not present

## 2023-04-25 DIAGNOSIS — K219 Gastro-esophageal reflux disease without esophagitis: Secondary | ICD-10-CM | POA: Diagnosis not present

## 2023-04-25 DIAGNOSIS — E038 Other specified hypothyroidism: Secondary | ICD-10-CM | POA: Diagnosis not present

## 2023-04-26 DIAGNOSIS — K219 Gastro-esophageal reflux disease without esophagitis: Secondary | ICD-10-CM | POA: Diagnosis not present

## 2023-04-26 DIAGNOSIS — I10 Essential (primary) hypertension: Secondary | ICD-10-CM | POA: Diagnosis not present

## 2023-04-26 DIAGNOSIS — E038 Other specified hypothyroidism: Secondary | ICD-10-CM | POA: Diagnosis not present

## 2023-04-26 DIAGNOSIS — Z96652 Presence of left artificial knee joint: Secondary | ICD-10-CM | POA: Diagnosis not present

## 2023-04-26 DIAGNOSIS — E119 Type 2 diabetes mellitus without complications: Secondary | ICD-10-CM | POA: Diagnosis not present

## 2023-04-26 DIAGNOSIS — Z4789 Encounter for other orthopedic aftercare: Secondary | ICD-10-CM | POA: Diagnosis not present

## 2023-04-26 DIAGNOSIS — I152 Hypertension secondary to endocrine disorders: Secondary | ICD-10-CM | POA: Diagnosis not present

## 2023-04-26 DIAGNOSIS — J3089 Other allergic rhinitis: Secondary | ICD-10-CM | POA: Diagnosis not present

## 2023-04-26 DIAGNOSIS — G8918 Other acute postprocedural pain: Secondary | ICD-10-CM | POA: Diagnosis not present

## 2023-04-26 DIAGNOSIS — Z7984 Long term (current) use of oral hypoglycemic drugs: Secondary | ICD-10-CM | POA: Diagnosis not present

## 2023-04-26 DIAGNOSIS — M1712 Unilateral primary osteoarthritis, left knee: Secondary | ICD-10-CM | POA: Diagnosis not present

## 2023-04-26 DIAGNOSIS — R2689 Other abnormalities of gait and mobility: Secondary | ICD-10-CM | POA: Diagnosis not present

## 2023-04-26 DIAGNOSIS — M6259 Muscle wasting and atrophy, not elsewhere classified, multiple sites: Secondary | ICD-10-CM | POA: Diagnosis not present

## 2023-04-26 DIAGNOSIS — G894 Chronic pain syndrome: Secondary | ICD-10-CM | POA: Diagnosis not present

## 2023-04-26 DIAGNOSIS — E039 Hypothyroidism, unspecified: Secondary | ICD-10-CM | POA: Diagnosis not present

## 2023-04-26 DIAGNOSIS — R1314 Dysphagia, pharyngoesophageal phase: Secondary | ICD-10-CM | POA: Diagnosis not present

## 2023-04-26 DIAGNOSIS — E785 Hyperlipidemia, unspecified: Secondary | ICD-10-CM | POA: Diagnosis not present

## 2023-04-26 DIAGNOSIS — R262 Difficulty in walking, not elsewhere classified: Secondary | ICD-10-CM | POA: Diagnosis not present

## 2023-04-26 DIAGNOSIS — R2681 Unsteadiness on feet: Secondary | ICD-10-CM | POA: Diagnosis not present

## 2023-04-26 DIAGNOSIS — E1159 Type 2 diabetes mellitus with other circulatory complications: Secondary | ICD-10-CM | POA: Diagnosis not present

## 2023-04-27 ENCOUNTER — Other Ambulatory Visit: Payer: Self-pay | Admitting: Internal Medicine

## 2023-04-28 DIAGNOSIS — E1159 Type 2 diabetes mellitus with other circulatory complications: Secondary | ICD-10-CM | POA: Diagnosis not present

## 2023-04-28 DIAGNOSIS — E785 Hyperlipidemia, unspecified: Secondary | ICD-10-CM | POA: Diagnosis not present

## 2023-04-28 DIAGNOSIS — Z4789 Encounter for other orthopedic aftercare: Secondary | ICD-10-CM | POA: Diagnosis not present

## 2023-04-28 DIAGNOSIS — I152 Hypertension secondary to endocrine disorders: Secondary | ICD-10-CM | POA: Diagnosis not present

## 2023-05-02 DIAGNOSIS — I152 Hypertension secondary to endocrine disorders: Secondary | ICD-10-CM | POA: Diagnosis not present

## 2023-05-02 DIAGNOSIS — E1159 Type 2 diabetes mellitus with other circulatory complications: Secondary | ICD-10-CM | POA: Diagnosis not present

## 2023-05-02 DIAGNOSIS — Z4789 Encounter for other orthopedic aftercare: Secondary | ICD-10-CM | POA: Diagnosis not present

## 2023-05-02 DIAGNOSIS — E785 Hyperlipidemia, unspecified: Secondary | ICD-10-CM | POA: Diagnosis not present

## 2023-05-05 DIAGNOSIS — E1151 Type 2 diabetes mellitus with diabetic peripheral angiopathy without gangrene: Secondary | ICD-10-CM | POA: Diagnosis not present

## 2023-05-05 DIAGNOSIS — J3089 Other allergic rhinitis: Secondary | ICD-10-CM | POA: Diagnosis not present

## 2023-05-05 DIAGNOSIS — E1122 Type 2 diabetes mellitus with diabetic chronic kidney disease: Secondary | ICD-10-CM | POA: Diagnosis not present

## 2023-05-05 DIAGNOSIS — M4800 Spinal stenosis, site unspecified: Secondary | ICD-10-CM | POA: Diagnosis not present

## 2023-05-05 DIAGNOSIS — Z79891 Long term (current) use of opiate analgesic: Secondary | ICD-10-CM | POA: Diagnosis not present

## 2023-05-05 DIAGNOSIS — E1159 Type 2 diabetes mellitus with other circulatory complications: Secondary | ICD-10-CM | POA: Diagnosis not present

## 2023-05-05 DIAGNOSIS — Z9181 History of falling: Secondary | ICD-10-CM | POA: Diagnosis not present

## 2023-05-05 DIAGNOSIS — Z96652 Presence of left artificial knee joint: Secondary | ICD-10-CM | POA: Diagnosis not present

## 2023-05-05 DIAGNOSIS — J4489 Other specified chronic obstructive pulmonary disease: Secondary | ICD-10-CM | POA: Diagnosis not present

## 2023-05-05 DIAGNOSIS — I152 Hypertension secondary to endocrine disorders: Secondary | ICD-10-CM | POA: Diagnosis not present

## 2023-05-05 DIAGNOSIS — E039 Hypothyroidism, unspecified: Secondary | ICD-10-CM | POA: Diagnosis not present

## 2023-05-05 DIAGNOSIS — Z471 Aftercare following joint replacement surgery: Secondary | ICD-10-CM | POA: Diagnosis not present

## 2023-05-05 DIAGNOSIS — K5900 Constipation, unspecified: Secondary | ICD-10-CM | POA: Diagnosis not present

## 2023-05-05 DIAGNOSIS — N1831 Chronic kidney disease, stage 3a: Secondary | ICD-10-CM | POA: Diagnosis not present

## 2023-05-05 DIAGNOSIS — Z87440 Personal history of urinary (tract) infections: Secondary | ICD-10-CM | POA: Diagnosis not present

## 2023-05-05 DIAGNOSIS — G894 Chronic pain syndrome: Secondary | ICD-10-CM | POA: Diagnosis not present

## 2023-05-05 DIAGNOSIS — Z7982 Long term (current) use of aspirin: Secondary | ICD-10-CM | POA: Diagnosis not present

## 2023-05-05 DIAGNOSIS — E785 Hyperlipidemia, unspecified: Secondary | ICD-10-CM | POA: Diagnosis not present

## 2023-05-05 DIAGNOSIS — Z7984 Long term (current) use of oral hypoglycemic drugs: Secondary | ICD-10-CM | POA: Diagnosis not present

## 2023-05-05 DIAGNOSIS — M810 Age-related osteoporosis without current pathological fracture: Secondary | ICD-10-CM | POA: Diagnosis not present

## 2023-05-05 DIAGNOSIS — K219 Gastro-esophageal reflux disease without esophagitis: Secondary | ICD-10-CM | POA: Diagnosis not present

## 2023-05-05 DIAGNOSIS — M797 Fibromyalgia: Secondary | ICD-10-CM | POA: Diagnosis not present

## 2023-05-06 DIAGNOSIS — M810 Age-related osteoporosis without current pathological fracture: Secondary | ICD-10-CM | POA: Diagnosis not present

## 2023-05-06 DIAGNOSIS — N1831 Chronic kidney disease, stage 3a: Secondary | ICD-10-CM | POA: Diagnosis not present

## 2023-05-06 DIAGNOSIS — J3089 Other allergic rhinitis: Secondary | ICD-10-CM | POA: Diagnosis not present

## 2023-05-06 DIAGNOSIS — Z79891 Long term (current) use of opiate analgesic: Secondary | ICD-10-CM | POA: Diagnosis not present

## 2023-05-06 DIAGNOSIS — E785 Hyperlipidemia, unspecified: Secondary | ICD-10-CM | POA: Diagnosis not present

## 2023-05-06 DIAGNOSIS — Z87440 Personal history of urinary (tract) infections: Secondary | ICD-10-CM | POA: Diagnosis not present

## 2023-05-06 DIAGNOSIS — K219 Gastro-esophageal reflux disease without esophagitis: Secondary | ICD-10-CM | POA: Diagnosis not present

## 2023-05-06 DIAGNOSIS — E039 Hypothyroidism, unspecified: Secondary | ICD-10-CM | POA: Diagnosis not present

## 2023-05-06 DIAGNOSIS — Z7984 Long term (current) use of oral hypoglycemic drugs: Secondary | ICD-10-CM | POA: Diagnosis not present

## 2023-05-06 DIAGNOSIS — I152 Hypertension secondary to endocrine disorders: Secondary | ICD-10-CM | POA: Diagnosis not present

## 2023-05-06 DIAGNOSIS — Z96652 Presence of left artificial knee joint: Secondary | ICD-10-CM | POA: Diagnosis not present

## 2023-05-06 DIAGNOSIS — M6281 Muscle weakness (generalized): Secondary | ICD-10-CM | POA: Diagnosis not present

## 2023-05-06 DIAGNOSIS — Z7982 Long term (current) use of aspirin: Secondary | ICD-10-CM | POA: Diagnosis not present

## 2023-05-06 DIAGNOSIS — K5900 Constipation, unspecified: Secondary | ICD-10-CM | POA: Diagnosis not present

## 2023-05-06 DIAGNOSIS — Z471 Aftercare following joint replacement surgery: Secondary | ICD-10-CM | POA: Diagnosis not present

## 2023-05-06 DIAGNOSIS — I119 Hypertensive heart disease without heart failure: Secondary | ICD-10-CM | POA: Diagnosis not present

## 2023-05-06 DIAGNOSIS — M4800 Spinal stenosis, site unspecified: Secondary | ICD-10-CM | POA: Diagnosis not present

## 2023-05-06 DIAGNOSIS — Z9181 History of falling: Secondary | ICD-10-CM | POA: Diagnosis not present

## 2023-05-06 DIAGNOSIS — J4489 Other specified chronic obstructive pulmonary disease: Secondary | ICD-10-CM | POA: Diagnosis not present

## 2023-05-06 DIAGNOSIS — M797 Fibromyalgia: Secondary | ICD-10-CM | POA: Diagnosis not present

## 2023-05-06 DIAGNOSIS — I129 Hypertensive chronic kidney disease with stage 1 through stage 4 chronic kidney disease, or unspecified chronic kidney disease: Secondary | ICD-10-CM | POA: Diagnosis not present

## 2023-05-06 DIAGNOSIS — E1159 Type 2 diabetes mellitus with other circulatory complications: Secondary | ICD-10-CM | POA: Diagnosis not present

## 2023-05-06 DIAGNOSIS — R269 Unspecified abnormalities of gait and mobility: Secondary | ICD-10-CM | POA: Diagnosis not present

## 2023-05-06 DIAGNOSIS — E1140 Type 2 diabetes mellitus with diabetic neuropathy, unspecified: Secondary | ICD-10-CM | POA: Diagnosis not present

## 2023-05-06 DIAGNOSIS — M1712 Unilateral primary osteoarthritis, left knee: Secondary | ICD-10-CM | POA: Diagnosis not present

## 2023-05-06 DIAGNOSIS — E1122 Type 2 diabetes mellitus with diabetic chronic kidney disease: Secondary | ICD-10-CM | POA: Diagnosis not present

## 2023-05-06 DIAGNOSIS — E1151 Type 2 diabetes mellitus with diabetic peripheral angiopathy without gangrene: Secondary | ICD-10-CM | POA: Diagnosis not present

## 2023-05-06 DIAGNOSIS — G894 Chronic pain syndrome: Secondary | ICD-10-CM | POA: Diagnosis not present

## 2023-05-09 DIAGNOSIS — M797 Fibromyalgia: Secondary | ICD-10-CM | POA: Diagnosis not present

## 2023-05-09 DIAGNOSIS — J4489 Other specified chronic obstructive pulmonary disease: Secondary | ICD-10-CM | POA: Diagnosis not present

## 2023-05-09 DIAGNOSIS — Z471 Aftercare following joint replacement surgery: Secondary | ICD-10-CM | POA: Diagnosis not present

## 2023-05-09 DIAGNOSIS — J3089 Other allergic rhinitis: Secondary | ICD-10-CM | POA: Diagnosis not present

## 2023-05-09 DIAGNOSIS — K5900 Constipation, unspecified: Secondary | ICD-10-CM | POA: Diagnosis not present

## 2023-05-09 DIAGNOSIS — E1122 Type 2 diabetes mellitus with diabetic chronic kidney disease: Secondary | ICD-10-CM | POA: Diagnosis not present

## 2023-05-09 DIAGNOSIS — N1831 Chronic kidney disease, stage 3a: Secondary | ICD-10-CM | POA: Diagnosis not present

## 2023-05-09 DIAGNOSIS — E039 Hypothyroidism, unspecified: Secondary | ICD-10-CM | POA: Diagnosis not present

## 2023-05-09 DIAGNOSIS — K219 Gastro-esophageal reflux disease without esophagitis: Secondary | ICD-10-CM | POA: Diagnosis not present

## 2023-05-09 DIAGNOSIS — M810 Age-related osteoporosis without current pathological fracture: Secondary | ICD-10-CM | POA: Diagnosis not present

## 2023-05-09 DIAGNOSIS — Z79891 Long term (current) use of opiate analgesic: Secondary | ICD-10-CM | POA: Diagnosis not present

## 2023-05-09 DIAGNOSIS — E1159 Type 2 diabetes mellitus with other circulatory complications: Secondary | ICD-10-CM | POA: Diagnosis not present

## 2023-05-09 DIAGNOSIS — Z9181 History of falling: Secondary | ICD-10-CM | POA: Diagnosis not present

## 2023-05-09 DIAGNOSIS — Z87440 Personal history of urinary (tract) infections: Secondary | ICD-10-CM | POA: Diagnosis not present

## 2023-05-09 DIAGNOSIS — E785 Hyperlipidemia, unspecified: Secondary | ICD-10-CM | POA: Diagnosis not present

## 2023-05-09 DIAGNOSIS — Z7984 Long term (current) use of oral hypoglycemic drugs: Secondary | ICD-10-CM | POA: Diagnosis not present

## 2023-05-09 DIAGNOSIS — E1151 Type 2 diabetes mellitus with diabetic peripheral angiopathy without gangrene: Secondary | ICD-10-CM | POA: Diagnosis not present

## 2023-05-09 DIAGNOSIS — G894 Chronic pain syndrome: Secondary | ICD-10-CM | POA: Diagnosis not present

## 2023-05-09 DIAGNOSIS — I152 Hypertension secondary to endocrine disorders: Secondary | ICD-10-CM | POA: Diagnosis not present

## 2023-05-09 DIAGNOSIS — Z96652 Presence of left artificial knee joint: Secondary | ICD-10-CM | POA: Diagnosis not present

## 2023-05-09 DIAGNOSIS — Z7982 Long term (current) use of aspirin: Secondary | ICD-10-CM | POA: Diagnosis not present

## 2023-05-09 DIAGNOSIS — M4800 Spinal stenosis, site unspecified: Secondary | ICD-10-CM | POA: Diagnosis not present

## 2023-05-11 DIAGNOSIS — Z7984 Long term (current) use of oral hypoglycemic drugs: Secondary | ICD-10-CM | POA: Diagnosis not present

## 2023-05-11 DIAGNOSIS — K5900 Constipation, unspecified: Secondary | ICD-10-CM | POA: Diagnosis not present

## 2023-05-11 DIAGNOSIS — Z7982 Long term (current) use of aspirin: Secondary | ICD-10-CM | POA: Diagnosis not present

## 2023-05-11 DIAGNOSIS — Z87440 Personal history of urinary (tract) infections: Secondary | ICD-10-CM | POA: Diagnosis not present

## 2023-05-11 DIAGNOSIS — Z9181 History of falling: Secondary | ICD-10-CM | POA: Diagnosis not present

## 2023-05-11 DIAGNOSIS — E1159 Type 2 diabetes mellitus with other circulatory complications: Secondary | ICD-10-CM | POA: Diagnosis not present

## 2023-05-11 DIAGNOSIS — E1151 Type 2 diabetes mellitus with diabetic peripheral angiopathy without gangrene: Secondary | ICD-10-CM | POA: Diagnosis not present

## 2023-05-11 DIAGNOSIS — J4489 Other specified chronic obstructive pulmonary disease: Secondary | ICD-10-CM | POA: Diagnosis not present

## 2023-05-11 DIAGNOSIS — I152 Hypertension secondary to endocrine disorders: Secondary | ICD-10-CM | POA: Diagnosis not present

## 2023-05-11 DIAGNOSIS — M4800 Spinal stenosis, site unspecified: Secondary | ICD-10-CM | POA: Diagnosis not present

## 2023-05-11 DIAGNOSIS — Z471 Aftercare following joint replacement surgery: Secondary | ICD-10-CM | POA: Diagnosis not present

## 2023-05-11 DIAGNOSIS — Z96652 Presence of left artificial knee joint: Secondary | ICD-10-CM | POA: Diagnosis not present

## 2023-05-11 DIAGNOSIS — J3089 Other allergic rhinitis: Secondary | ICD-10-CM | POA: Diagnosis not present

## 2023-05-11 DIAGNOSIS — M797 Fibromyalgia: Secondary | ICD-10-CM | POA: Diagnosis not present

## 2023-05-11 DIAGNOSIS — G894 Chronic pain syndrome: Secondary | ICD-10-CM | POA: Diagnosis not present

## 2023-05-11 DIAGNOSIS — Z79891 Long term (current) use of opiate analgesic: Secondary | ICD-10-CM | POA: Diagnosis not present

## 2023-05-11 DIAGNOSIS — N1831 Chronic kidney disease, stage 3a: Secondary | ICD-10-CM | POA: Diagnosis not present

## 2023-05-11 DIAGNOSIS — E1122 Type 2 diabetes mellitus with diabetic chronic kidney disease: Secondary | ICD-10-CM | POA: Diagnosis not present

## 2023-05-11 DIAGNOSIS — E785 Hyperlipidemia, unspecified: Secondary | ICD-10-CM | POA: Diagnosis not present

## 2023-05-11 DIAGNOSIS — E039 Hypothyroidism, unspecified: Secondary | ICD-10-CM | POA: Diagnosis not present

## 2023-05-11 DIAGNOSIS — M810 Age-related osteoporosis without current pathological fracture: Secondary | ICD-10-CM | POA: Diagnosis not present

## 2023-05-11 DIAGNOSIS — K219 Gastro-esophageal reflux disease without esophagitis: Secondary | ICD-10-CM | POA: Diagnosis not present

## 2023-05-12 DIAGNOSIS — I152 Hypertension secondary to endocrine disorders: Secondary | ICD-10-CM | POA: Diagnosis not present

## 2023-05-12 DIAGNOSIS — E039 Hypothyroidism, unspecified: Secondary | ICD-10-CM | POA: Diagnosis not present

## 2023-05-12 DIAGNOSIS — Z7982 Long term (current) use of aspirin: Secondary | ICD-10-CM | POA: Diagnosis not present

## 2023-05-12 DIAGNOSIS — M810 Age-related osteoporosis without current pathological fracture: Secondary | ICD-10-CM | POA: Diagnosis not present

## 2023-05-12 DIAGNOSIS — E1122 Type 2 diabetes mellitus with diabetic chronic kidney disease: Secondary | ICD-10-CM | POA: Diagnosis not present

## 2023-05-12 DIAGNOSIS — Z96652 Presence of left artificial knee joint: Secondary | ICD-10-CM | POA: Diagnosis not present

## 2023-05-12 DIAGNOSIS — E1151 Type 2 diabetes mellitus with diabetic peripheral angiopathy without gangrene: Secondary | ICD-10-CM | POA: Diagnosis not present

## 2023-05-12 DIAGNOSIS — G894 Chronic pain syndrome: Secondary | ICD-10-CM | POA: Diagnosis not present

## 2023-05-12 DIAGNOSIS — J3089 Other allergic rhinitis: Secondary | ICD-10-CM | POA: Diagnosis not present

## 2023-05-12 DIAGNOSIS — Z471 Aftercare following joint replacement surgery: Secondary | ICD-10-CM | POA: Diagnosis not present

## 2023-05-12 DIAGNOSIS — N1831 Chronic kidney disease, stage 3a: Secondary | ICD-10-CM | POA: Diagnosis not present

## 2023-05-12 DIAGNOSIS — E785 Hyperlipidemia, unspecified: Secondary | ICD-10-CM | POA: Diagnosis not present

## 2023-05-12 DIAGNOSIS — M797 Fibromyalgia: Secondary | ICD-10-CM | POA: Diagnosis not present

## 2023-05-12 DIAGNOSIS — Z87440 Personal history of urinary (tract) infections: Secondary | ICD-10-CM | POA: Diagnosis not present

## 2023-05-12 DIAGNOSIS — Z79891 Long term (current) use of opiate analgesic: Secondary | ICD-10-CM | POA: Diagnosis not present

## 2023-05-12 DIAGNOSIS — K219 Gastro-esophageal reflux disease without esophagitis: Secondary | ICD-10-CM | POA: Diagnosis not present

## 2023-05-12 DIAGNOSIS — M4800 Spinal stenosis, site unspecified: Secondary | ICD-10-CM | POA: Diagnosis not present

## 2023-05-12 DIAGNOSIS — Z9181 History of falling: Secondary | ICD-10-CM | POA: Diagnosis not present

## 2023-05-12 DIAGNOSIS — J4489 Other specified chronic obstructive pulmonary disease: Secondary | ICD-10-CM | POA: Diagnosis not present

## 2023-05-12 DIAGNOSIS — E1159 Type 2 diabetes mellitus with other circulatory complications: Secondary | ICD-10-CM | POA: Diagnosis not present

## 2023-05-12 DIAGNOSIS — Z7984 Long term (current) use of oral hypoglycemic drugs: Secondary | ICD-10-CM | POA: Diagnosis not present

## 2023-05-12 DIAGNOSIS — K5900 Constipation, unspecified: Secondary | ICD-10-CM | POA: Diagnosis not present

## 2023-05-16 DIAGNOSIS — M797 Fibromyalgia: Secondary | ICD-10-CM | POA: Diagnosis not present

## 2023-05-16 DIAGNOSIS — M4800 Spinal stenosis, site unspecified: Secondary | ICD-10-CM | POA: Diagnosis not present

## 2023-05-16 DIAGNOSIS — Z7982 Long term (current) use of aspirin: Secondary | ICD-10-CM | POA: Diagnosis not present

## 2023-05-16 DIAGNOSIS — E1151 Type 2 diabetes mellitus with diabetic peripheral angiopathy without gangrene: Secondary | ICD-10-CM | POA: Diagnosis not present

## 2023-05-16 DIAGNOSIS — Z96652 Presence of left artificial knee joint: Secondary | ICD-10-CM | POA: Diagnosis not present

## 2023-05-16 DIAGNOSIS — N1831 Chronic kidney disease, stage 3a: Secondary | ICD-10-CM | POA: Diagnosis not present

## 2023-05-16 DIAGNOSIS — I152 Hypertension secondary to endocrine disorders: Secondary | ICD-10-CM | POA: Diagnosis not present

## 2023-05-16 DIAGNOSIS — Z471 Aftercare following joint replacement surgery: Secondary | ICD-10-CM | POA: Diagnosis not present

## 2023-05-16 DIAGNOSIS — J4489 Other specified chronic obstructive pulmonary disease: Secondary | ICD-10-CM | POA: Diagnosis not present

## 2023-05-16 DIAGNOSIS — E1159 Type 2 diabetes mellitus with other circulatory complications: Secondary | ICD-10-CM | POA: Diagnosis not present

## 2023-05-16 DIAGNOSIS — G894 Chronic pain syndrome: Secondary | ICD-10-CM | POA: Diagnosis not present

## 2023-05-16 DIAGNOSIS — E039 Hypothyroidism, unspecified: Secondary | ICD-10-CM | POA: Diagnosis not present

## 2023-05-16 DIAGNOSIS — Z87440 Personal history of urinary (tract) infections: Secondary | ICD-10-CM | POA: Diagnosis not present

## 2023-05-16 DIAGNOSIS — E785 Hyperlipidemia, unspecified: Secondary | ICD-10-CM | POA: Diagnosis not present

## 2023-05-16 DIAGNOSIS — M810 Age-related osteoporosis without current pathological fracture: Secondary | ICD-10-CM | POA: Diagnosis not present

## 2023-05-16 DIAGNOSIS — Z7984 Long term (current) use of oral hypoglycemic drugs: Secondary | ICD-10-CM | POA: Diagnosis not present

## 2023-05-16 DIAGNOSIS — K219 Gastro-esophageal reflux disease without esophagitis: Secondary | ICD-10-CM | POA: Diagnosis not present

## 2023-05-16 DIAGNOSIS — Z79891 Long term (current) use of opiate analgesic: Secondary | ICD-10-CM | POA: Diagnosis not present

## 2023-05-16 DIAGNOSIS — Z9181 History of falling: Secondary | ICD-10-CM | POA: Diagnosis not present

## 2023-05-16 DIAGNOSIS — E1122 Type 2 diabetes mellitus with diabetic chronic kidney disease: Secondary | ICD-10-CM | POA: Diagnosis not present

## 2023-05-16 DIAGNOSIS — K5900 Constipation, unspecified: Secondary | ICD-10-CM | POA: Diagnosis not present

## 2023-05-16 DIAGNOSIS — J3089 Other allergic rhinitis: Secondary | ICD-10-CM | POA: Diagnosis not present

## 2023-05-17 DIAGNOSIS — I152 Hypertension secondary to endocrine disorders: Secondary | ICD-10-CM | POA: Diagnosis not present

## 2023-05-17 DIAGNOSIS — E1159 Type 2 diabetes mellitus with other circulatory complications: Secondary | ICD-10-CM | POA: Diagnosis not present

## 2023-05-17 DIAGNOSIS — E785 Hyperlipidemia, unspecified: Secondary | ICD-10-CM | POA: Diagnosis not present

## 2023-05-17 DIAGNOSIS — Z9181 History of falling: Secondary | ICD-10-CM | POA: Diagnosis not present

## 2023-05-17 DIAGNOSIS — M4800 Spinal stenosis, site unspecified: Secondary | ICD-10-CM | POA: Diagnosis not present

## 2023-05-17 DIAGNOSIS — E1151 Type 2 diabetes mellitus with diabetic peripheral angiopathy without gangrene: Secondary | ICD-10-CM | POA: Diagnosis not present

## 2023-05-17 DIAGNOSIS — M797 Fibromyalgia: Secondary | ICD-10-CM | POA: Diagnosis not present

## 2023-05-17 DIAGNOSIS — Z7982 Long term (current) use of aspirin: Secondary | ICD-10-CM | POA: Diagnosis not present

## 2023-05-17 DIAGNOSIS — N1831 Chronic kidney disease, stage 3a: Secondary | ICD-10-CM | POA: Diagnosis not present

## 2023-05-17 DIAGNOSIS — Z87440 Personal history of urinary (tract) infections: Secondary | ICD-10-CM | POA: Diagnosis not present

## 2023-05-17 DIAGNOSIS — M810 Age-related osteoporosis without current pathological fracture: Secondary | ICD-10-CM | POA: Diagnosis not present

## 2023-05-17 DIAGNOSIS — E039 Hypothyroidism, unspecified: Secondary | ICD-10-CM | POA: Diagnosis not present

## 2023-05-17 DIAGNOSIS — Z96652 Presence of left artificial knee joint: Secondary | ICD-10-CM | POA: Diagnosis not present

## 2023-05-17 DIAGNOSIS — E1122 Type 2 diabetes mellitus with diabetic chronic kidney disease: Secondary | ICD-10-CM | POA: Diagnosis not present

## 2023-05-17 DIAGNOSIS — Z7984 Long term (current) use of oral hypoglycemic drugs: Secondary | ICD-10-CM | POA: Diagnosis not present

## 2023-05-17 DIAGNOSIS — Z79891 Long term (current) use of opiate analgesic: Secondary | ICD-10-CM | POA: Diagnosis not present

## 2023-05-17 DIAGNOSIS — K5900 Constipation, unspecified: Secondary | ICD-10-CM | POA: Diagnosis not present

## 2023-05-17 DIAGNOSIS — J4489 Other specified chronic obstructive pulmonary disease: Secondary | ICD-10-CM | POA: Diagnosis not present

## 2023-05-17 DIAGNOSIS — Z471 Aftercare following joint replacement surgery: Secondary | ICD-10-CM | POA: Diagnosis not present

## 2023-05-17 DIAGNOSIS — J3089 Other allergic rhinitis: Secondary | ICD-10-CM | POA: Diagnosis not present

## 2023-05-17 DIAGNOSIS — K219 Gastro-esophageal reflux disease without esophagitis: Secondary | ICD-10-CM | POA: Diagnosis not present

## 2023-05-17 DIAGNOSIS — G894 Chronic pain syndrome: Secondary | ICD-10-CM | POA: Diagnosis not present

## 2023-05-18 ENCOUNTER — Ambulatory Visit: Payer: 59

## 2023-05-18 DIAGNOSIS — Z87440 Personal history of urinary (tract) infections: Secondary | ICD-10-CM | POA: Diagnosis not present

## 2023-05-18 DIAGNOSIS — I152 Hypertension secondary to endocrine disorders: Secondary | ICD-10-CM | POA: Diagnosis not present

## 2023-05-18 DIAGNOSIS — R011 Cardiac murmur, unspecified: Secondary | ICD-10-CM

## 2023-05-18 DIAGNOSIS — J4489 Other specified chronic obstructive pulmonary disease: Secondary | ICD-10-CM | POA: Diagnosis not present

## 2023-05-18 DIAGNOSIS — M797 Fibromyalgia: Secondary | ICD-10-CM | POA: Diagnosis not present

## 2023-05-18 DIAGNOSIS — M810 Age-related osteoporosis without current pathological fracture: Secondary | ICD-10-CM | POA: Diagnosis not present

## 2023-05-18 DIAGNOSIS — N1831 Chronic kidney disease, stage 3a: Secondary | ICD-10-CM | POA: Diagnosis not present

## 2023-05-18 DIAGNOSIS — K219 Gastro-esophageal reflux disease without esophagitis: Secondary | ICD-10-CM | POA: Diagnosis not present

## 2023-05-18 DIAGNOSIS — Z96652 Presence of left artificial knee joint: Secondary | ICD-10-CM | POA: Diagnosis not present

## 2023-05-18 DIAGNOSIS — Z471 Aftercare following joint replacement surgery: Secondary | ICD-10-CM | POA: Diagnosis not present

## 2023-05-18 DIAGNOSIS — E559 Vitamin D deficiency, unspecified: Secondary | ICD-10-CM | POA: Diagnosis not present

## 2023-05-18 DIAGNOSIS — G894 Chronic pain syndrome: Secondary | ICD-10-CM | POA: Diagnosis not present

## 2023-05-18 DIAGNOSIS — M4800 Spinal stenosis, site unspecified: Secondary | ICD-10-CM | POA: Diagnosis not present

## 2023-05-18 DIAGNOSIS — E785 Hyperlipidemia, unspecified: Secondary | ICD-10-CM | POA: Diagnosis not present

## 2023-05-18 DIAGNOSIS — D509 Iron deficiency anemia, unspecified: Secondary | ICD-10-CM

## 2023-05-18 DIAGNOSIS — E1140 Type 2 diabetes mellitus with diabetic neuropathy, unspecified: Secondary | ICD-10-CM | POA: Diagnosis not present

## 2023-05-18 DIAGNOSIS — J3089 Other allergic rhinitis: Secondary | ICD-10-CM | POA: Diagnosis not present

## 2023-05-18 DIAGNOSIS — E039 Hypothyroidism, unspecified: Secondary | ICD-10-CM

## 2023-05-18 DIAGNOSIS — Z79891 Long term (current) use of opiate analgesic: Secondary | ICD-10-CM | POA: Diagnosis not present

## 2023-05-18 DIAGNOSIS — E1151 Type 2 diabetes mellitus with diabetic peripheral angiopathy without gangrene: Secondary | ICD-10-CM | POA: Diagnosis not present

## 2023-05-18 DIAGNOSIS — E1122 Type 2 diabetes mellitus with diabetic chronic kidney disease: Secondary | ICD-10-CM | POA: Diagnosis not present

## 2023-05-18 DIAGNOSIS — Z7984 Long term (current) use of oral hypoglycemic drugs: Secondary | ICD-10-CM | POA: Diagnosis not present

## 2023-05-18 DIAGNOSIS — E1159 Type 2 diabetes mellitus with other circulatory complications: Secondary | ICD-10-CM | POA: Diagnosis not present

## 2023-05-18 DIAGNOSIS — I10 Essential (primary) hypertension: Secondary | ICD-10-CM

## 2023-05-18 DIAGNOSIS — Z9181 History of falling: Secondary | ICD-10-CM | POA: Diagnosis not present

## 2023-05-18 DIAGNOSIS — Z7982 Long term (current) use of aspirin: Secondary | ICD-10-CM | POA: Diagnosis not present

## 2023-05-18 DIAGNOSIS — K5900 Constipation, unspecified: Secondary | ICD-10-CM | POA: Diagnosis not present

## 2023-05-18 NOTE — Progress Notes (Signed)
Labs ordered by [employer]

## 2023-05-19 LAB — CBC WITH DIFFERENTIAL/PLATELET
Basophils Absolute: 0 10*3/uL (ref 0.0–0.2)
Basos: 0 %
EOS (ABSOLUTE): 0.1 10*3/uL (ref 0.0–0.4)
Eos: 3 %
Hematocrit: 35.5 % (ref 34.0–46.6)
Hemoglobin: 10.9 g/dL — ABNORMAL LOW (ref 11.1–15.9)
Immature Grans (Abs): 0 10*3/uL (ref 0.0–0.1)
Immature Granulocytes: 0 %
Lymphocytes Absolute: 1 10*3/uL (ref 0.7–3.1)
Lymphs: 19 %
MCH: 27.3 pg (ref 26.6–33.0)
MCHC: 30.7 g/dL — ABNORMAL LOW (ref 31.5–35.7)
MCV: 89 fL (ref 79–97)
Monocytes Absolute: 0.3 10*3/uL (ref 0.1–0.9)
Monocytes: 6 %
Neutrophils Absolute: 3.9 10*3/uL (ref 1.4–7.0)
Neutrophils: 72 %
Platelets: 293 10*3/uL (ref 150–450)
RBC: 3.99 x10E6/uL (ref 3.77–5.28)
RDW: 15.2 % (ref 11.7–15.4)
WBC: 5.3 10*3/uL (ref 3.4–10.8)

## 2023-05-19 LAB — BASIC METABOLIC PANEL
BUN/Creatinine Ratio: 15 (ref 12–28)
BUN: 14 mg/dL (ref 8–27)
CO2: 23 mmol/L (ref 20–29)
Calcium: 9.2 mg/dL (ref 8.7–10.3)
Chloride: 98 mmol/L (ref 96–106)
Creatinine, Ser: 0.91 mg/dL (ref 0.57–1.00)
Glucose: 111 mg/dL — ABNORMAL HIGH (ref 70–99)
Potassium: 4.1 mmol/L (ref 3.5–5.2)
Sodium: 139 mmol/L (ref 134–144)
eGFR: 63 mL/min/{1.73_m2} (ref >59)

## 2023-05-19 LAB — IRON,TIBC AND FERRITIN PANEL
Ferritin: 81 ng/mL (ref 15–150)
Iron Saturation: 9 % — CL (ref 15–55)
Iron: 36 ug/dL (ref 27–139)
Total Iron Binding Capacity: 386 ug/dL (ref 250–450)
UIBC: 350 ug/dL (ref 118–369)

## 2023-05-19 LAB — HEMOGLOBIN A1C
Est. average glucose Bld gHb Est-mCnc: 126 mg/dL
Hgb A1c MFr Bld: 6 % — ABNORMAL HIGH (ref 4.8–5.6)

## 2023-05-19 LAB — VITAMIN D 25 HYDROXY (VIT D DEFICIENCY, FRACTURES): Vit D, 25-Hydroxy: 48 ng/mL (ref 30.0–100.0)

## 2023-05-19 LAB — TSH: TSH: 4.28 u[IU]/mL (ref 0.450–4.500)

## 2023-05-20 DIAGNOSIS — Z79891 Long term (current) use of opiate analgesic: Secondary | ICD-10-CM | POA: Diagnosis not present

## 2023-05-20 DIAGNOSIS — G894 Chronic pain syndrome: Secondary | ICD-10-CM | POA: Diagnosis not present

## 2023-05-20 DIAGNOSIS — Z7984 Long term (current) use of oral hypoglycemic drugs: Secondary | ICD-10-CM | POA: Diagnosis not present

## 2023-05-20 DIAGNOSIS — K219 Gastro-esophageal reflux disease without esophagitis: Secondary | ICD-10-CM | POA: Diagnosis not present

## 2023-05-20 DIAGNOSIS — N1831 Chronic kidney disease, stage 3a: Secondary | ICD-10-CM | POA: Diagnosis not present

## 2023-05-20 DIAGNOSIS — J4489 Other specified chronic obstructive pulmonary disease: Secondary | ICD-10-CM | POA: Diagnosis not present

## 2023-05-20 DIAGNOSIS — M4800 Spinal stenosis, site unspecified: Secondary | ICD-10-CM | POA: Diagnosis not present

## 2023-05-20 DIAGNOSIS — Z87440 Personal history of urinary (tract) infections: Secondary | ICD-10-CM | POA: Diagnosis not present

## 2023-05-20 DIAGNOSIS — M810 Age-related osteoporosis without current pathological fracture: Secondary | ICD-10-CM | POA: Diagnosis not present

## 2023-05-20 DIAGNOSIS — Z9181 History of falling: Secondary | ICD-10-CM | POA: Diagnosis not present

## 2023-05-20 DIAGNOSIS — Z471 Aftercare following joint replacement surgery: Secondary | ICD-10-CM | POA: Diagnosis not present

## 2023-05-20 DIAGNOSIS — E785 Hyperlipidemia, unspecified: Secondary | ICD-10-CM | POA: Diagnosis not present

## 2023-05-20 DIAGNOSIS — E039 Hypothyroidism, unspecified: Secondary | ICD-10-CM | POA: Diagnosis not present

## 2023-05-20 DIAGNOSIS — Z96652 Presence of left artificial knee joint: Secondary | ICD-10-CM | POA: Diagnosis not present

## 2023-05-20 DIAGNOSIS — I152 Hypertension secondary to endocrine disorders: Secondary | ICD-10-CM | POA: Diagnosis not present

## 2023-05-20 DIAGNOSIS — E1159 Type 2 diabetes mellitus with other circulatory complications: Secondary | ICD-10-CM | POA: Diagnosis not present

## 2023-05-20 DIAGNOSIS — E1151 Type 2 diabetes mellitus with diabetic peripheral angiopathy without gangrene: Secondary | ICD-10-CM | POA: Diagnosis not present

## 2023-05-20 DIAGNOSIS — K5900 Constipation, unspecified: Secondary | ICD-10-CM | POA: Diagnosis not present

## 2023-05-20 DIAGNOSIS — E1122 Type 2 diabetes mellitus with diabetic chronic kidney disease: Secondary | ICD-10-CM | POA: Diagnosis not present

## 2023-05-20 DIAGNOSIS — J3089 Other allergic rhinitis: Secondary | ICD-10-CM | POA: Diagnosis not present

## 2023-05-20 DIAGNOSIS — Z7982 Long term (current) use of aspirin: Secondary | ICD-10-CM | POA: Diagnosis not present

## 2023-05-20 DIAGNOSIS — M797 Fibromyalgia: Secondary | ICD-10-CM | POA: Diagnosis not present

## 2023-05-24 DIAGNOSIS — E1165 Type 2 diabetes mellitus with hyperglycemia: Secondary | ICD-10-CM | POA: Diagnosis not present

## 2023-05-24 NOTE — Progress Notes (Signed)
These labs were sent to Kyra Leyland, NP who ordered them. Labs were received.

## 2023-05-25 DIAGNOSIS — E1122 Type 2 diabetes mellitus with diabetic chronic kidney disease: Secondary | ICD-10-CM | POA: Diagnosis not present

## 2023-05-25 DIAGNOSIS — M4800 Spinal stenosis, site unspecified: Secondary | ICD-10-CM | POA: Diagnosis not present

## 2023-05-25 DIAGNOSIS — J3089 Other allergic rhinitis: Secondary | ICD-10-CM | POA: Diagnosis not present

## 2023-05-25 DIAGNOSIS — K219 Gastro-esophageal reflux disease without esophagitis: Secondary | ICD-10-CM | POA: Diagnosis not present

## 2023-05-25 DIAGNOSIS — M797 Fibromyalgia: Secondary | ICD-10-CM | POA: Diagnosis not present

## 2023-05-25 DIAGNOSIS — E039 Hypothyroidism, unspecified: Secondary | ICD-10-CM | POA: Diagnosis not present

## 2023-05-25 DIAGNOSIS — Z87440 Personal history of urinary (tract) infections: Secondary | ICD-10-CM | POA: Diagnosis not present

## 2023-05-25 DIAGNOSIS — E785 Hyperlipidemia, unspecified: Secondary | ICD-10-CM | POA: Diagnosis not present

## 2023-05-25 DIAGNOSIS — G894 Chronic pain syndrome: Secondary | ICD-10-CM | POA: Diagnosis not present

## 2023-05-25 DIAGNOSIS — Z7982 Long term (current) use of aspirin: Secondary | ICD-10-CM | POA: Diagnosis not present

## 2023-05-25 DIAGNOSIS — Z9181 History of falling: Secondary | ICD-10-CM | POA: Diagnosis not present

## 2023-05-25 DIAGNOSIS — E1151 Type 2 diabetes mellitus with diabetic peripheral angiopathy without gangrene: Secondary | ICD-10-CM | POA: Diagnosis not present

## 2023-05-25 DIAGNOSIS — Z7984 Long term (current) use of oral hypoglycemic drugs: Secondary | ICD-10-CM | POA: Diagnosis not present

## 2023-05-25 DIAGNOSIS — Z471 Aftercare following joint replacement surgery: Secondary | ICD-10-CM | POA: Diagnosis not present

## 2023-05-25 DIAGNOSIS — E1159 Type 2 diabetes mellitus with other circulatory complications: Secondary | ICD-10-CM | POA: Diagnosis not present

## 2023-05-25 DIAGNOSIS — N1831 Chronic kidney disease, stage 3a: Secondary | ICD-10-CM | POA: Diagnosis not present

## 2023-05-25 DIAGNOSIS — Z96652 Presence of left artificial knee joint: Secondary | ICD-10-CM | POA: Diagnosis not present

## 2023-05-25 DIAGNOSIS — I152 Hypertension secondary to endocrine disorders: Secondary | ICD-10-CM | POA: Diagnosis not present

## 2023-05-25 DIAGNOSIS — K5900 Constipation, unspecified: Secondary | ICD-10-CM | POA: Diagnosis not present

## 2023-05-25 DIAGNOSIS — J4489 Other specified chronic obstructive pulmonary disease: Secondary | ICD-10-CM | POA: Diagnosis not present

## 2023-05-25 DIAGNOSIS — M810 Age-related osteoporosis without current pathological fracture: Secondary | ICD-10-CM | POA: Diagnosis not present

## 2023-05-25 DIAGNOSIS — Z79891 Long term (current) use of opiate analgesic: Secondary | ICD-10-CM | POA: Diagnosis not present

## 2023-05-27 DIAGNOSIS — Z7982 Long term (current) use of aspirin: Secondary | ICD-10-CM | POA: Diagnosis not present

## 2023-05-27 DIAGNOSIS — I152 Hypertension secondary to endocrine disorders: Secondary | ICD-10-CM | POA: Diagnosis not present

## 2023-05-27 DIAGNOSIS — J4489 Other specified chronic obstructive pulmonary disease: Secondary | ICD-10-CM | POA: Diagnosis not present

## 2023-05-27 DIAGNOSIS — M4800 Spinal stenosis, site unspecified: Secondary | ICD-10-CM | POA: Diagnosis not present

## 2023-05-27 DIAGNOSIS — Z87440 Personal history of urinary (tract) infections: Secondary | ICD-10-CM | POA: Diagnosis not present

## 2023-05-27 DIAGNOSIS — Z96652 Presence of left artificial knee joint: Secondary | ICD-10-CM | POA: Diagnosis not present

## 2023-05-27 DIAGNOSIS — G894 Chronic pain syndrome: Secondary | ICD-10-CM | POA: Diagnosis not present

## 2023-05-27 DIAGNOSIS — N1831 Chronic kidney disease, stage 3a: Secondary | ICD-10-CM | POA: Diagnosis not present

## 2023-05-27 DIAGNOSIS — M797 Fibromyalgia: Secondary | ICD-10-CM | POA: Diagnosis not present

## 2023-05-27 DIAGNOSIS — E785 Hyperlipidemia, unspecified: Secondary | ICD-10-CM | POA: Diagnosis not present

## 2023-05-27 DIAGNOSIS — M810 Age-related osteoporosis without current pathological fracture: Secondary | ICD-10-CM | POA: Diagnosis not present

## 2023-05-27 DIAGNOSIS — K219 Gastro-esophageal reflux disease without esophagitis: Secondary | ICD-10-CM | POA: Diagnosis not present

## 2023-05-27 DIAGNOSIS — K5900 Constipation, unspecified: Secondary | ICD-10-CM | POA: Diagnosis not present

## 2023-05-27 DIAGNOSIS — E1159 Type 2 diabetes mellitus with other circulatory complications: Secondary | ICD-10-CM | POA: Diagnosis not present

## 2023-05-27 DIAGNOSIS — E1122 Type 2 diabetes mellitus with diabetic chronic kidney disease: Secondary | ICD-10-CM | POA: Diagnosis not present

## 2023-05-27 DIAGNOSIS — E1151 Type 2 diabetes mellitus with diabetic peripheral angiopathy without gangrene: Secondary | ICD-10-CM | POA: Diagnosis not present

## 2023-05-27 DIAGNOSIS — J3089 Other allergic rhinitis: Secondary | ICD-10-CM | POA: Diagnosis not present

## 2023-05-27 DIAGNOSIS — Z7984 Long term (current) use of oral hypoglycemic drugs: Secondary | ICD-10-CM | POA: Diagnosis not present

## 2023-05-27 DIAGNOSIS — Z9181 History of falling: Secondary | ICD-10-CM | POA: Diagnosis not present

## 2023-05-27 DIAGNOSIS — Z471 Aftercare following joint replacement surgery: Secondary | ICD-10-CM | POA: Diagnosis not present

## 2023-05-27 DIAGNOSIS — E039 Hypothyroidism, unspecified: Secondary | ICD-10-CM | POA: Diagnosis not present

## 2023-05-27 DIAGNOSIS — Z79891 Long term (current) use of opiate analgesic: Secondary | ICD-10-CM | POA: Diagnosis not present

## 2023-05-30 ENCOUNTER — Other Ambulatory Visit: Payer: Self-pay | Admitting: Internal Medicine

## 2023-05-30 DIAGNOSIS — K5900 Constipation, unspecified: Secondary | ICD-10-CM | POA: Diagnosis not present

## 2023-05-30 DIAGNOSIS — N1831 Chronic kidney disease, stage 3a: Secondary | ICD-10-CM | POA: Diagnosis not present

## 2023-05-30 DIAGNOSIS — M4800 Spinal stenosis, site unspecified: Secondary | ICD-10-CM | POA: Diagnosis not present

## 2023-05-30 DIAGNOSIS — E1159 Type 2 diabetes mellitus with other circulatory complications: Secondary | ICD-10-CM | POA: Diagnosis not present

## 2023-05-30 DIAGNOSIS — Z79891 Long term (current) use of opiate analgesic: Secondary | ICD-10-CM | POA: Diagnosis not present

## 2023-05-30 DIAGNOSIS — J3089 Other allergic rhinitis: Secondary | ICD-10-CM | POA: Diagnosis not present

## 2023-05-30 DIAGNOSIS — E1122 Type 2 diabetes mellitus with diabetic chronic kidney disease: Secondary | ICD-10-CM | POA: Diagnosis not present

## 2023-05-30 DIAGNOSIS — K219 Gastro-esophageal reflux disease without esophagitis: Secondary | ICD-10-CM | POA: Diagnosis not present

## 2023-05-30 DIAGNOSIS — M810 Age-related osteoporosis without current pathological fracture: Secondary | ICD-10-CM | POA: Diagnosis not present

## 2023-05-30 DIAGNOSIS — E1151 Type 2 diabetes mellitus with diabetic peripheral angiopathy without gangrene: Secondary | ICD-10-CM | POA: Diagnosis not present

## 2023-05-30 DIAGNOSIS — Z7984 Long term (current) use of oral hypoglycemic drugs: Secondary | ICD-10-CM | POA: Diagnosis not present

## 2023-05-30 DIAGNOSIS — Z9181 History of falling: Secondary | ICD-10-CM | POA: Diagnosis not present

## 2023-05-30 DIAGNOSIS — J4489 Other specified chronic obstructive pulmonary disease: Secondary | ICD-10-CM | POA: Diagnosis not present

## 2023-05-30 DIAGNOSIS — I10 Essential (primary) hypertension: Secondary | ICD-10-CM

## 2023-05-30 DIAGNOSIS — Z7982 Long term (current) use of aspirin: Secondary | ICD-10-CM | POA: Diagnosis not present

## 2023-05-30 DIAGNOSIS — Z87440 Personal history of urinary (tract) infections: Secondary | ICD-10-CM | POA: Diagnosis not present

## 2023-05-30 DIAGNOSIS — E785 Hyperlipidemia, unspecified: Secondary | ICD-10-CM | POA: Diagnosis not present

## 2023-05-30 DIAGNOSIS — I152 Hypertension secondary to endocrine disorders: Secondary | ICD-10-CM | POA: Diagnosis not present

## 2023-05-30 DIAGNOSIS — G894 Chronic pain syndrome: Secondary | ICD-10-CM | POA: Diagnosis not present

## 2023-05-30 DIAGNOSIS — E039 Hypothyroidism, unspecified: Secondary | ICD-10-CM | POA: Diagnosis not present

## 2023-05-30 DIAGNOSIS — Z471 Aftercare following joint replacement surgery: Secondary | ICD-10-CM | POA: Diagnosis not present

## 2023-05-30 DIAGNOSIS — Z96652 Presence of left artificial knee joint: Secondary | ICD-10-CM | POA: Diagnosis not present

## 2023-05-30 DIAGNOSIS — M797 Fibromyalgia: Secondary | ICD-10-CM | POA: Diagnosis not present

## 2023-05-31 DIAGNOSIS — M1712 Unilateral primary osteoarthritis, left knee: Secondary | ICD-10-CM | POA: Diagnosis not present

## 2023-06-08 DIAGNOSIS — M25562 Pain in left knee: Secondary | ICD-10-CM | POA: Diagnosis not present

## 2023-06-08 DIAGNOSIS — R2689 Other abnormalities of gait and mobility: Secondary | ICD-10-CM | POA: Diagnosis not present

## 2023-06-08 DIAGNOSIS — M25462 Effusion, left knee: Secondary | ICD-10-CM | POA: Diagnosis not present

## 2023-06-08 DIAGNOSIS — M25662 Stiffness of left knee, not elsewhere classified: Secondary | ICD-10-CM | POA: Diagnosis not present

## 2023-06-09 DIAGNOSIS — R3982 Chronic bladder pain: Secondary | ICD-10-CM | POA: Diagnosis not present

## 2023-06-09 DIAGNOSIS — N390 Urinary tract infection, site not specified: Secondary | ICD-10-CM | POA: Diagnosis not present

## 2023-06-15 DIAGNOSIS — M25662 Stiffness of left knee, not elsewhere classified: Secondary | ICD-10-CM | POA: Diagnosis not present

## 2023-06-15 DIAGNOSIS — M25462 Effusion, left knee: Secondary | ICD-10-CM | POA: Diagnosis not present

## 2023-06-15 DIAGNOSIS — M25562 Pain in left knee: Secondary | ICD-10-CM | POA: Diagnosis not present

## 2023-06-15 DIAGNOSIS — R2689 Other abnormalities of gait and mobility: Secondary | ICD-10-CM | POA: Diagnosis not present

## 2023-06-28 DIAGNOSIS — M961 Postlaminectomy syndrome, not elsewhere classified: Secondary | ICD-10-CM | POA: Diagnosis not present

## 2023-06-28 DIAGNOSIS — Z79891 Long term (current) use of opiate analgesic: Secondary | ICD-10-CM | POA: Diagnosis not present

## 2023-06-28 DIAGNOSIS — M5136 Other intervertebral disc degeneration, lumbar region: Secondary | ICD-10-CM | POA: Diagnosis not present

## 2023-06-28 DIAGNOSIS — G629 Polyneuropathy, unspecified: Secondary | ICD-10-CM | POA: Diagnosis not present

## 2023-06-28 DIAGNOSIS — M170 Bilateral primary osteoarthritis of knee: Secondary | ICD-10-CM | POA: Diagnosis not present

## 2023-07-10 ENCOUNTER — Other Ambulatory Visit: Payer: Self-pay | Admitting: Internal Medicine

## 2023-08-18 ENCOUNTER — Other Ambulatory Visit: Payer: Self-pay | Admitting: Internal Medicine

## 2023-09-08 DIAGNOSIS — G894 Chronic pain syndrome: Secondary | ICD-10-CM | POA: Diagnosis not present

## 2023-09-08 DIAGNOSIS — I739 Peripheral vascular disease, unspecified: Secondary | ICD-10-CM | POA: Diagnosis not present

## 2023-09-08 DIAGNOSIS — E1140 Type 2 diabetes mellitus with diabetic neuropathy, unspecified: Secondary | ICD-10-CM | POA: Diagnosis not present

## 2023-09-08 DIAGNOSIS — E039 Hypothyroidism, unspecified: Secondary | ICD-10-CM | POA: Diagnosis not present

## 2023-09-08 DIAGNOSIS — K219 Gastro-esophageal reflux disease without esophagitis: Secondary | ICD-10-CM | POA: Diagnosis not present

## 2023-09-08 DIAGNOSIS — M1990 Unspecified osteoarthritis, unspecified site: Secondary | ICD-10-CM | POA: Diagnosis not present

## 2023-09-08 DIAGNOSIS — I119 Hypertensive heart disease without heart failure: Secondary | ICD-10-CM | POA: Diagnosis not present

## 2023-09-08 DIAGNOSIS — D509 Iron deficiency anemia, unspecified: Secondary | ICD-10-CM | POA: Diagnosis not present

## 2023-09-08 DIAGNOSIS — N1831 Chronic kidney disease, stage 3a: Secondary | ICD-10-CM | POA: Diagnosis not present

## 2023-09-08 DIAGNOSIS — M810 Age-related osteoporosis without current pathological fracture: Secondary | ICD-10-CM | POA: Diagnosis not present

## 2023-09-27 DIAGNOSIS — M170 Bilateral primary osteoarthritis of knee: Secondary | ICD-10-CM | POA: Diagnosis not present

## 2023-09-27 DIAGNOSIS — G629 Polyneuropathy, unspecified: Secondary | ICD-10-CM | POA: Diagnosis not present

## 2023-09-27 DIAGNOSIS — M961 Postlaminectomy syndrome, not elsewhere classified: Secondary | ICD-10-CM | POA: Diagnosis not present

## 2023-09-27 DIAGNOSIS — Z5181 Encounter for therapeutic drug level monitoring: Secondary | ICD-10-CM | POA: Diagnosis not present

## 2023-09-27 DIAGNOSIS — M51362 Other intervertebral disc degeneration, lumbar region with discogenic back pain and lower extremity pain: Secondary | ICD-10-CM | POA: Diagnosis not present

## 2023-09-29 ENCOUNTER — Ambulatory Visit: Payer: 59

## 2023-09-29 DIAGNOSIS — I10 Essential (primary) hypertension: Secondary | ICD-10-CM | POA: Diagnosis not present

## 2023-09-29 DIAGNOSIS — E1101 Type 2 diabetes mellitus with hyperosmolarity with coma: Secondary | ICD-10-CM | POA: Diagnosis not present

## 2023-09-29 DIAGNOSIS — Z79899 Other long term (current) drug therapy: Secondary | ICD-10-CM

## 2023-09-29 DIAGNOSIS — D509 Iron deficiency anemia, unspecified: Secondary | ICD-10-CM | POA: Diagnosis not present

## 2023-09-29 NOTE — Progress Notes (Signed)
Labs/[employer]

## 2023-09-30 LAB — CBC WITH DIFFERENTIAL/PLATELET
Basophils Absolute: 0 10*3/uL (ref 0.0–0.2)
Basos: 0 %
EOS (ABSOLUTE): 0.1 10*3/uL (ref 0.0–0.4)
Eos: 2 %
Hematocrit: 42.3 % (ref 34.0–46.6)
Hemoglobin: 13.3 g/dL (ref 11.1–15.9)
Immature Grans (Abs): 0 10*3/uL (ref 0.0–0.1)
Immature Granulocytes: 0 %
Lymphocytes Absolute: 1.4 10*3/uL (ref 0.7–3.1)
Lymphs: 24 %
MCH: 28.9 pg (ref 26.6–33.0)
MCHC: 31.4 g/dL — ABNORMAL LOW (ref 31.5–35.7)
MCV: 92 fL (ref 79–97)
Monocytes Absolute: 0.3 10*3/uL (ref 0.1–0.9)
Monocytes: 5 %
Neutrophils Absolute: 4 10*3/uL (ref 1.4–7.0)
Neutrophils: 69 %
Platelets: 291 10*3/uL (ref 150–450)
RBC: 4.61 x10E6/uL (ref 3.77–5.28)
RDW: 13.8 % (ref 11.7–15.4)
WBC: 5.8 10*3/uL (ref 3.4–10.8)

## 2023-09-30 LAB — BASIC METABOLIC PANEL
BUN/Creatinine Ratio: 16 (ref 12–28)
BUN: 14 mg/dL (ref 8–27)
CO2: 26 mmol/L (ref 20–29)
Calcium: 9.6 mg/dL (ref 8.7–10.3)
Chloride: 98 mmol/L (ref 96–106)
Creatinine, Ser: 0.85 mg/dL (ref 0.57–1.00)
Glucose: 173 mg/dL — ABNORMAL HIGH (ref 70–99)
Potassium: 4 mmol/L (ref 3.5–5.2)
Sodium: 140 mmol/L (ref 134–144)
eGFR: 68 mL/min/{1.73_m2} (ref >59)

## 2023-09-30 LAB — IRON,TIBC AND FERRITIN PANEL
Ferritin: 96 ng/mL (ref 15–150)
Iron Saturation: 17 % (ref 15–55)
Iron: 67 ug/dL (ref 27–139)
Total Iron Binding Capacity: 401 ug/dL (ref 250–450)
UIBC: 334 ug/dL (ref 118–369)

## 2023-09-30 LAB — HEMOGLOBIN A1C
Est. average glucose Bld gHb Est-mCnc: 131 mg/dL
Hgb A1c MFr Bld: 6.2 % — ABNORMAL HIGH (ref 4.8–5.6)

## 2023-10-01 ENCOUNTER — Other Ambulatory Visit: Payer: Self-pay | Admitting: Internal Medicine

## 2023-10-01 DIAGNOSIS — I10 Essential (primary) hypertension: Secondary | ICD-10-CM

## 2023-10-03 ENCOUNTER — Other Ambulatory Visit: Payer: Self-pay | Admitting: Internal Medicine

## 2023-10-11 DIAGNOSIS — N390 Urinary tract infection, site not specified: Secondary | ICD-10-CM | POA: Diagnosis not present

## 2023-10-11 DIAGNOSIS — R3982 Chronic bladder pain: Secondary | ICD-10-CM | POA: Diagnosis not present

## 2023-10-31 ENCOUNTER — Other Ambulatory Visit: Payer: Self-pay | Admitting: Internal Medicine

## 2023-10-31 DIAGNOSIS — I10 Essential (primary) hypertension: Secondary | ICD-10-CM

## 2023-11-01 ENCOUNTER — Other Ambulatory Visit: Payer: Self-pay | Admitting: Internal Medicine

## 2023-11-30 ENCOUNTER — Other Ambulatory Visit: Payer: Self-pay | Admitting: Internal Medicine

## 2023-12-02 ENCOUNTER — Ambulatory Visit (INDEPENDENT_AMBULATORY_CARE_PROVIDER_SITE_OTHER): Payer: 59 | Admitting: Podiatry

## 2023-12-02 ENCOUNTER — Ambulatory Visit (INDEPENDENT_AMBULATORY_CARE_PROVIDER_SITE_OTHER): Payer: 59

## 2023-12-02 DIAGNOSIS — M778 Other enthesopathies, not elsewhere classified: Secondary | ICD-10-CM | POA: Diagnosis not present

## 2023-12-02 DIAGNOSIS — M79675 Pain in left toe(s): Secondary | ICD-10-CM

## 2023-12-02 DIAGNOSIS — D2372 Other benign neoplasm of skin of left lower limb, including hip: Secondary | ICD-10-CM | POA: Diagnosis not present

## 2023-12-02 DIAGNOSIS — L84 Corns and callosities: Secondary | ICD-10-CM | POA: Diagnosis not present

## 2023-12-04 NOTE — Progress Notes (Signed)
 Chief Complaint  Patient presents with   Toe Pain    Toe pain, Left great pad of toe. Dark spot could be a splinter.  A1c 6.0 ASA81   HPI: 83 y.o. female presents today with concern of a possible splinter in her left great toe.  She began her appointment stating do not lecture me about wearing shoes.  She does not recall actually stepping on a foreign object but states it feels like there is a splinter in the toe.  It is only painful with weightbearing and pressure on the area.  Past Medical History:  Diagnosis Date   Arthritis    Chronic bilateral low back pain with bilateral sciatica 02/28/2018   Last Assessment & Plan:  Patient understands she may benefit from percutaneous interventions of the lumbar spine.  Patient reports having previous lumbar spine surgery.  Last Assessment & Plan:  Formatting of this note might be different from the original. Patient has undergone multiple percutaneous interventions, most recent left-sided L3 transforaminal in February.   Chronic pain of left knee 06/28/2019   Last Assessment & Plan:  Formatting of this note might be different from the original. Following with Orthopedics, receiving regular intra-articular injections.  Received most recent within the last several weeks.   Chronic pain syndrome    Class 1 obesity due to excess calories without serious comorbidity with body mass index (BMI) of 32.0 to 32.9 in adult 01/12/2019   Last Assessment & Plan:  Patient understands any weight loss she is able to achieve only help benefit her overall health and pain issues.  Last Assessment & Plan:  Formatting of this note might be different from the original. Patient understands any weight loss she is able to achieve will only help benefit her overall health and pain issues.   Constipation    COPD (chronic obstructive pulmonary disease) (HCC)    DDD (degenerative disc disease), cervical 02/28/2018   Last Assessment & Plan:  Patient understands she may benefit  from percutaneous interventions of the cervical spine.  Patient will continue with medications.  Last Assessment & Plan:  Formatting of this note might be different from the original. Patient understands she may benefit from percutaneous interventions of the cervical spine.  We will continue with current medications.   DDD (degenerative disc disease), lumbar 02/28/2018   Last Assessment & Plan:  Formatting of this note might be different from the original. See laminectomy plan   Degenerative disc disease, lumbar    Diabetes mellitus with neuropathy (HCC)    Esophageal reflux    Facet joint disease 02/28/2018   Last Assessment & Plan:  Patient understands she may benefit from percutaneous interventions to include medial branch blocks and possible radiofrequency ablations.  Last Assessment & Plan:  Formatting of this note might be different from the original. Patient understands she may benefit from percutaneous interventions.  Patient has had previous lumbar spine surgeries.   Fibromyalgia    History of blood clots    Hyperlipidemia    Hypertension, essential, benign    Joint pain    Lupus (HCC)    Muscle spasm of back 02/28/2018   Last Assessment & Plan:  Patient has Baclofen 10 mg that she uses as needed for muscle spasms.  She does not need a refill on this today.  Last Assessment & Plan:  Formatting of this note might be different from the original. Discontinue use of all muscle relaxants due to intolerance   Neck  pain 02/28/2018   Last Assessment & Plan:  Patient understands she may benefit from percutaneous interventions of the cervical spine.  Last Assessment & Plan:  Formatting of this note might be different from the original. Patient understands she may benefit from percutaneous interventions the cervical spine.   Neuropathy 02/28/2018   Last Assessment & Plan:  Patient will continue gabapentin 400 mg every 8 hours.  Last Assessment & Plan:  Formatting of this note might be different from the  original. Continue gabapentin as prescribed by PCP   Osteoarthritis    Other chronic pain 02/28/2018   Last Assessment & Plan:  Patient understands she may benefit from percutaneous interventions.  We will continue with medications.  There is an opioid agreement on file within the clinic.  Last Assessment & Plan:  Formatting of this note might be different from the original. Patient understands she may benefit from percutaneous interventions.  We will continue with medications.  There is an opioid    Pain due to total right knee replacement (HCC) 09/02/2020   Pain medication agreement 01/12/2019   Last Assessment & Plan:  There is a current opioid agreement on file within the clinic and urine drug screens will be collected as needed to be in compliance with clinic policies.  Last Assessment & Plan:  Formatting of this note might be different from the original. Drug screen completed today.  Review of prior drug screens are all within normal limits.  [place]  controlled substance regist   Rheumatoid arthritis (HCC)    S/P laminectomy with spinal fusion 02/28/2018   Last Assessment & Plan:  Patient reports to having previous lumbar spine surgery.  Patient understands she may benefit from percutaneous interventions of the lumbar spine.  Last Assessment & Plan:  Formatting of this note is different from the original. 83 year old female who has undergone prior L4 through S1 fusion and unfortunately has since developed adjacent segment syndrome.  Review of recent   Shortness of breath    Therapeutic opioid-induced constipation (OIC) 02/28/2018   Last Assessment & Plan:  Formatting of this note might be different from the original. Continue Movantik 25 daily   Thyroid  condition    Urinary tract infection, recurrent    Vitamin D  deficiency     Past Surgical History:  Procedure Laterality Date   BACK SURGERY     X 3   other     nerves clipped left knee   PARTIAL HYSTERECTOMY     REPLACEMENT TOTAL KNEE  Right     Allergies  Allergen Reactions   Lisinopril Anaphylaxis   Azithromycin    Celecoxib    Morphine Nausea And Vomiting   Nitrofurantoin    Quinapril     Physical Exam: Trace palpable pedal pulses left foot.  Varicosities noted in the legs and ankles.  The skin on the plantar aspect of the left hallux have 2 tiny superficial abrasions noted with 1 spot of dried blood present.  Slightly more proximal to this near the toe sulcus, there is a small focal hyperkeratotic lesion.  No evidence of splinter is visualized in the toe.  When each of the abrasions were palpated, this did not reproduce her pain.  When the plantar corn was palpated, this was the exact location of her pain.  Radiographic Exam (left foot, 3 partial weightbearing views, 12/02/2023):  Normal osseous mineralization. Joint spaces preserved.  No fractures or osseous irregularities noted.  No evidence of foreign body seen within the soft tissues  of the plantar aspect of the hallux  Assessment/Plan of Care: 1. Corns   2. Capsulitis of foot   3. Pain in left toe(s)   4. Benign neoplasm of skin of left foot     Discussed clinical and radiographic findings with patient today.  The hyperkeratotic lesion was shaved with a sterile #[id number] blade.  1 application of Cantharone solution was applied to the skin lesion followed by an occlusive Band-Aid.  Patient was informed there may be a small blister that developed 24 hours from now.  Otherwise no other treatment needed.  May need to follow-up in approximately 1 month to recheck or as needed.   Awanda CHARM Imperial, DPM, FACFAS [facility]     [address] [place], [place] [address]                Office [phone]  Fax [phone]

## 2023-12-11 ENCOUNTER — Other Ambulatory Visit: Payer: Self-pay | Admitting: Internal Medicine

## 2023-12-11 DIAGNOSIS — E088 Diabetes mellitus due to underlying condition with unspecified complications: Secondary | ICD-10-CM

## 2024-01-11 DIAGNOSIS — M1990 Unspecified osteoarthritis, unspecified site: Secondary | ICD-10-CM | POA: Diagnosis not present

## 2024-01-11 DIAGNOSIS — K219 Gastro-esophageal reflux disease without esophagitis: Secondary | ICD-10-CM | POA: Diagnosis not present

## 2024-01-11 DIAGNOSIS — N1831 Chronic kidney disease, stage 3a: Secondary | ICD-10-CM | POA: Diagnosis not present

## 2024-01-11 DIAGNOSIS — D509 Iron deficiency anemia, unspecified: Secondary | ICD-10-CM | POA: Diagnosis not present

## 2024-01-11 DIAGNOSIS — E785 Hyperlipidemia, unspecified: Secondary | ICD-10-CM | POA: Diagnosis not present

## 2024-01-11 DIAGNOSIS — E039 Hypothyroidism, unspecified: Secondary | ICD-10-CM | POA: Diagnosis not present

## 2024-01-11 DIAGNOSIS — K5900 Constipation, unspecified: Secondary | ICD-10-CM | POA: Diagnosis not present

## 2024-01-11 DIAGNOSIS — G894 Chronic pain syndrome: Secondary | ICD-10-CM | POA: Diagnosis not present

## 2024-01-11 DIAGNOSIS — E559 Vitamin D deficiency, unspecified: Secondary | ICD-10-CM | POA: Diagnosis not present

## 2024-01-11 DIAGNOSIS — E1122 Type 2 diabetes mellitus with diabetic chronic kidney disease: Secondary | ICD-10-CM | POA: Diagnosis not present

## 2024-01-11 DIAGNOSIS — E1140 Type 2 diabetes mellitus with diabetic neuropathy, unspecified: Secondary | ICD-10-CM | POA: Diagnosis not present

## 2024-01-11 DIAGNOSIS — I129 Hypertensive chronic kidney disease with stage 1 through stage 4 chronic kidney disease, or unspecified chronic kidney disease: Secondary | ICD-10-CM | POA: Diagnosis not present

## 2024-01-14 ENCOUNTER — Other Ambulatory Visit: Payer: Self-pay | Admitting: Internal Medicine

## 2024-01-24 DIAGNOSIS — Z13228 Encounter for screening for other metabolic disorders: Secondary | ICD-10-CM | POA: Diagnosis not present

## 2024-01-24 DIAGNOSIS — M545 Low back pain, unspecified: Secondary | ICD-10-CM | POA: Diagnosis not present

## 2024-01-24 DIAGNOSIS — E782 Mixed hyperlipidemia: Secondary | ICD-10-CM | POA: Diagnosis not present

## 2024-01-24 DIAGNOSIS — E559 Vitamin D deficiency, unspecified: Secondary | ICD-10-CM | POA: Diagnosis not present

## 2024-01-24 DIAGNOSIS — E039 Hypothyroidism, unspecified: Secondary | ICD-10-CM | POA: Diagnosis not present

## 2024-01-24 DIAGNOSIS — I10 Essential (primary) hypertension: Secondary | ICD-10-CM | POA: Diagnosis not present

## 2024-01-24 DIAGNOSIS — K219 Gastro-esophageal reflux disease without esophagitis: Secondary | ICD-10-CM | POA: Diagnosis not present

## 2024-01-24 DIAGNOSIS — D649 Anemia, unspecified: Secondary | ICD-10-CM | POA: Diagnosis not present

## 2024-02-15 DIAGNOSIS — N390 Urinary tract infection, site not specified: Secondary | ICD-10-CM | POA: Diagnosis not present

## 2024-02-23 DIAGNOSIS — N390 Urinary tract infection, site not specified: Secondary | ICD-10-CM | POA: Diagnosis not present

## 2024-02-23 DIAGNOSIS — E039 Hypothyroidism, unspecified: Secondary | ICD-10-CM | POA: Diagnosis not present

## 2024-02-23 DIAGNOSIS — E0843 Diabetes mellitus due to underlying condition with diabetic autonomic (poly)neuropathy: Secondary | ICD-10-CM | POA: Diagnosis not present

## 2024-02-23 DIAGNOSIS — M170 Bilateral primary osteoarthritis of knee: Secondary | ICD-10-CM | POA: Diagnosis not present

## 2024-02-23 DIAGNOSIS — K219 Gastro-esophageal reflux disease without esophagitis: Secondary | ICD-10-CM | POA: Diagnosis not present

## 2024-02-23 DIAGNOSIS — E118 Type 2 diabetes mellitus with unspecified complications: Secondary | ICD-10-CM | POA: Diagnosis not present

## 2024-03-07 DIAGNOSIS — Z1389 Encounter for screening for other disorder: Secondary | ICD-10-CM | POA: Diagnosis not present

## 2024-03-07 DIAGNOSIS — Z79899 Other long term (current) drug therapy: Secondary | ICD-10-CM | POA: Diagnosis not present

## 2024-03-07 DIAGNOSIS — G629 Polyneuropathy, unspecified: Secondary | ICD-10-CM | POA: Diagnosis not present

## 2024-03-07 DIAGNOSIS — M797 Fibromyalgia: Secondary | ICD-10-CM | POA: Diagnosis not present

## 2024-03-07 DIAGNOSIS — M4800 Spinal stenosis, site unspecified: Secondary | ICD-10-CM | POA: Diagnosis not present

## 2024-03-07 DIAGNOSIS — G894 Chronic pain syndrome: Secondary | ICD-10-CM | POA: Diagnosis not present

## 2024-03-13 DIAGNOSIS — R202 Paresthesia of skin: Secondary | ICD-10-CM | POA: Diagnosis not present

## 2024-03-13 DIAGNOSIS — M25531 Pain in right wrist: Secondary | ICD-10-CM | POA: Diagnosis not present

## 2024-03-13 DIAGNOSIS — R200 Anesthesia of skin: Secondary | ICD-10-CM | POA: Diagnosis not present

## 2024-03-19 DIAGNOSIS — H9193 Unspecified hearing loss, bilateral: Secondary | ICD-10-CM | POA: Diagnosis not present

## 2024-03-26 ENCOUNTER — Other Ambulatory Visit: Payer: Self-pay | Admitting: Internal Medicine

## 2024-03-26 DIAGNOSIS — G629 Polyneuropathy, unspecified: Secondary | ICD-10-CM

## 2024-03-26 DIAGNOSIS — M961 Postlaminectomy syndrome, not elsewhere classified: Secondary | ICD-10-CM

## 2024-04-02 NOTE — Telephone Encounter (Signed)
 Pt requesting a call

## 2024-04-02 NOTE — Telephone Encounter (Signed)
 I was able to reach the patient and speak to her.  She was not needing a Gabapentin refill.  She had some loose stools and other symptoms and wanted to know if I could manage those symptoms for her.  I explained how to make an appointment with [facility] at [facility].  I also explained that I would be happy to see her and evaluate her but long term, I am not primary care and she would need to see her PCP.  She stated she understood and would make an appointment, if needed.

## 2024-04-04 DIAGNOSIS — G5603 Carpal tunnel syndrome, bilateral upper limbs: Secondary | ICD-10-CM | POA: Diagnosis not present

## 2024-04-04 DIAGNOSIS — G5613 Other lesions of median nerve, bilateral upper limbs: Secondary | ICD-10-CM | POA: Diagnosis not present

## 2024-04-05 ENCOUNTER — Other Ambulatory Visit: Payer: Self-pay | Admitting: Orthopedic Surgery

## 2024-04-05 DIAGNOSIS — D225 Melanocytic nevi of trunk: Secondary | ICD-10-CM | POA: Diagnosis not present

## 2024-04-05 DIAGNOSIS — L821 Other seborrheic keratosis: Secondary | ICD-10-CM | POA: Diagnosis not present

## 2024-04-05 DIAGNOSIS — L570 Actinic keratosis: Secondary | ICD-10-CM | POA: Diagnosis not present

## 2024-04-05 DIAGNOSIS — D2239 Melanocytic nevi of other parts of face: Secondary | ICD-10-CM | POA: Diagnosis not present

## 2024-04-05 DIAGNOSIS — L814 Other melanin hyperpigmentation: Secondary | ICD-10-CM | POA: Diagnosis not present

## 2024-04-09 DIAGNOSIS — N390 Urinary tract infection, site not specified: Secondary | ICD-10-CM | POA: Diagnosis not present

## 2024-04-18 DIAGNOSIS — M170 Bilateral primary osteoarthritis of knee: Secondary | ICD-10-CM | POA: Diagnosis not present

## 2024-04-18 DIAGNOSIS — G629 Polyneuropathy, unspecified: Secondary | ICD-10-CM | POA: Diagnosis not present

## 2024-04-18 DIAGNOSIS — M48062 Spinal stenosis, lumbar region with neurogenic claudication: Secondary | ICD-10-CM | POA: Diagnosis not present

## 2024-04-18 DIAGNOSIS — M51369 Other intervertebral disc degeneration, lumbar region without mention of lumbar back pain or lower extremity pain: Secondary | ICD-10-CM | POA: Diagnosis not present

## 2024-04-18 DIAGNOSIS — Z79891 Long term (current) use of opiate analgesic: Secondary | ICD-10-CM | POA: Diagnosis not present

## 2024-04-18 DIAGNOSIS — M5442 Lumbago with sciatica, left side: Secondary | ICD-10-CM | POA: Diagnosis not present

## 2024-04-18 DIAGNOSIS — M542 Cervicalgia: Secondary | ICD-10-CM | POA: Diagnosis not present

## 2024-04-18 DIAGNOSIS — M47819 Spondylosis without myelopathy or radiculopathy, site unspecified: Secondary | ICD-10-CM | POA: Diagnosis not present

## 2024-04-18 DIAGNOSIS — M47816 Spondylosis without myelopathy or radiculopathy, lumbar region: Secondary | ICD-10-CM | POA: Diagnosis not present

## 2024-04-18 DIAGNOSIS — M797 Fibromyalgia: Secondary | ICD-10-CM | POA: Diagnosis not present

## 2024-04-18 DIAGNOSIS — E1140 Type 2 diabetes mellitus with diabetic neuropathy, unspecified: Secondary | ICD-10-CM | POA: Diagnosis not present

## 2024-04-18 DIAGNOSIS — R011 Cardiac murmur, unspecified: Secondary | ICD-10-CM | POA: Diagnosis not present

## 2024-04-23 DIAGNOSIS — N390 Urinary tract infection, site not specified: Secondary | ICD-10-CM | POA: Diagnosis not present

## 2024-05-07 ENCOUNTER — Other Ambulatory Visit: Payer: Self-pay

## 2024-05-07 ENCOUNTER — Encounter (HOSPITAL_BASED_OUTPATIENT_CLINIC_OR_DEPARTMENT_OTHER): Payer: Self-pay | Admitting: Orthopedic Surgery

## 2024-05-08 ENCOUNTER — Encounter (HOSPITAL_BASED_OUTPATIENT_CLINIC_OR_DEPARTMENT_OTHER)
Admission: RE | Admit: 2024-05-08 | Discharge: 2024-05-08 | Disposition: A | Discharge status: Home / Self Care | Source: Ambulatory Visit | Attending: Orthopedic Surgery | Admitting: Orthopedic Surgery

## 2024-05-08 DIAGNOSIS — Z01812 Encounter for preprocedural laboratory examination: Secondary | ICD-10-CM | POA: Diagnosis present

## 2024-05-08 DIAGNOSIS — I444 Left anterior fascicular block: Secondary | ICD-10-CM | POA: Insufficient documentation

## 2024-05-08 DIAGNOSIS — Z01818 Encounter for other preprocedural examination: Secondary | ICD-10-CM | POA: Insufficient documentation

## 2024-05-08 DIAGNOSIS — E1140 Type 2 diabetes mellitus with diabetic neuropathy, unspecified: Secondary | ICD-10-CM | POA: Insufficient documentation

## 2024-05-08 DIAGNOSIS — Z01810 Encounter for preprocedural cardiovascular examination: Secondary | ICD-10-CM | POA: Diagnosis present

## 2024-05-08 DIAGNOSIS — Z794 Long term (current) use of insulin: Secondary | ICD-10-CM | POA: Insufficient documentation

## 2024-05-08 LAB — BASIC METABOLIC PANEL WITH GFR
Anion gap: 10 (ref 5–15)
BUN: 12 mg/dL (ref 8–23)
CO2: 29 mmol/L (ref 22–32)
Calcium: 9.9 mg/dL (ref 8.9–10.3)
Chloride: 98 mmol/L (ref 98–111)
Creatinine, Ser: 1.04 mg/dL — ABNORMAL HIGH (ref 0.44–1.00)
GFR, Estimated: 53 mL/min — ABNORMAL LOW (ref >60)
Glucose, Bld: 196 mg/dL — ABNORMAL HIGH (ref 70–99)
Potassium: 3.5 mmol/L (ref 3.5–5.1)
Sodium: 137 mmol/L (ref 135–145)

## 2024-05-08 NOTE — Progress Notes (Signed)

## 2024-05-14 ENCOUNTER — Ambulatory Visit (HOSPITAL_BASED_OUTPATIENT_CLINIC_OR_DEPARTMENT_OTHER): Admitting: Anesthesiology

## 2024-05-14 ENCOUNTER — Other Ambulatory Visit: Payer: Self-pay

## 2024-05-14 ENCOUNTER — Encounter (HOSPITAL_BASED_OUTPATIENT_CLINIC_OR_DEPARTMENT_OTHER): Payer: Self-pay | Admitting: Orthopedic Surgery

## 2024-05-14 ENCOUNTER — Encounter (HOSPITAL_BASED_OUTPATIENT_CLINIC_OR_DEPARTMENT_OTHER)
Admission: RE | Disposition: A | Discharge status: Home / Self Care | Payer: Self-pay | Source: Home / Self Care | Attending: Orthopedic Surgery

## 2024-05-14 ENCOUNTER — Ambulatory Visit (HOSPITAL_BASED_OUTPATIENT_CLINIC_OR_DEPARTMENT_OTHER)
Admission: RE | Admit: 2024-05-14 | Discharge: 2024-05-14 | Disposition: A | Discharge status: Home / Self Care | Attending: Orthopedic Surgery | Admitting: Orthopedic Surgery

## 2024-05-14 DIAGNOSIS — M797 Fibromyalgia: Secondary | ICD-10-CM | POA: Insufficient documentation

## 2024-05-14 DIAGNOSIS — J449 Chronic obstructive pulmonary disease, unspecified: Secondary | ICD-10-CM | POA: Insufficient documentation

## 2024-05-14 DIAGNOSIS — Z7985 Long-term (current) use of injectable non-insulin antidiabetic drugs: Secondary | ICD-10-CM | POA: Insufficient documentation

## 2024-05-14 DIAGNOSIS — E1141 Type 2 diabetes mellitus with diabetic mononeuropathy: Secondary | ICD-10-CM | POA: Diagnosis not present

## 2024-05-14 DIAGNOSIS — G5601 Carpal tunnel syndrome, right upper limb: Secondary | ICD-10-CM | POA: Insufficient documentation

## 2024-05-14 DIAGNOSIS — I10 Essential (primary) hypertension: Secondary | ICD-10-CM | POA: Insufficient documentation

## 2024-05-14 DIAGNOSIS — E1140 Type 2 diabetes mellitus with diabetic neuropathy, unspecified: Secondary | ICD-10-CM

## 2024-05-14 DIAGNOSIS — E119 Type 2 diabetes mellitus without complications: Secondary | ICD-10-CM | POA: Diagnosis not present

## 2024-05-14 DIAGNOSIS — Z7984 Long term (current) use of oral hypoglycemic drugs: Secondary | ICD-10-CM | POA: Diagnosis not present

## 2024-05-14 DIAGNOSIS — M069 Rheumatoid arthritis, unspecified: Secondary | ICD-10-CM | POA: Insufficient documentation

## 2024-05-14 HISTORY — PX: CARPAL TUNNEL RELEASE: SHX101

## 2024-05-14 LAB — GLUCOSE, CAPILLARY: Glucose-Capillary: 134 mg/dL — ABNORMAL HIGH (ref 70–99)

## 2024-05-14 SURGERY — CARPAL TUNNEL RELEASE
Anesthesia: Regional | Site: Hand | Laterality: Right

## 2024-05-14 MED ORDER — FENTANYL CITRATE (PF) 100 MCG/2ML IJ SOLN: 25.0000 ug | INTRAMUSCULAR | Status: DC | PRN

## 2024-05-14 MED ORDER — LIDOCAINE HCL (PF) 0.5 % IJ SOLN
INTRAMUSCULAR | Status: DC | PRN
  Administered 2024-05-14: 30 mL via INTRAVENOUS

## 2024-05-14 MED ORDER — 0.9 % SODIUM CHLORIDE (POUR BTL) OPTIME
TOPICAL | Status: DC | PRN
  Administered 2024-05-14: 75 mL

## 2024-05-14 MED ORDER — CEFAZOLIN SODIUM-DEXTROSE 2-4 GM/100ML-% IV SOLN
2.0000 g | INTRAVENOUS | Status: AC
  Administered 2024-05-14: 2 g via INTRAVENOUS

## 2024-05-14 MED ORDER — DROPERIDOL 2.5 MG/ML IJ SOLN: 0.6250 mg | Freq: Once | INTRAMUSCULAR | Status: DC | PRN

## 2024-05-14 MED ORDER — OXYCODONE HCL 5 MG/5ML PO SOLN: 5.0000 mg | Freq: Once | ORAL | Status: DC | PRN

## 2024-05-14 MED ORDER — ROCURONIUM BROMIDE 10 MG/ML (PF) SYRINGE
PREFILLED_SYRINGE | INTRAVENOUS | Status: AC
Start: 2024-05-14 — End: 2024-05-14
  Filled 2024-05-14: qty 20

## 2024-05-14 MED ORDER — LIDOCAINE 2% (20 MG/ML) 5 ML SYRINGE
INTRAMUSCULAR | Status: AC
  Filled 2024-05-14: qty 10

## 2024-05-14 MED ORDER — BUPIVACAINE HCL (PF) 0.25 % IJ SOLN
INTRAMUSCULAR | Status: DC | PRN
  Administered 2024-05-14: 9 mL

## 2024-05-14 MED ORDER — LACTATED RINGERS IV SOLN: INTRAVENOUS | Status: DC

## 2024-05-14 MED ORDER — PROPOFOL 500 MG/50ML IV EMUL
INTRAVENOUS | Status: AC
  Filled 2024-05-14: qty 50

## 2024-05-14 MED ORDER — CEFAZOLIN SODIUM-DEXTROSE 2-4 GM/100ML-% IV SOLN
INTRAVENOUS | Status: AC
  Filled 2024-05-14: qty 100

## 2024-05-14 MED ORDER — EPHEDRINE 5 MG/ML INJ
INTRAVENOUS | Status: AC
  Filled 2024-05-14: qty 5

## 2024-05-14 MED ORDER — SCOPOLAMINE 1 MG/3DAYS TD PT72
MEDICATED_PATCH | TRANSDERMAL | Status: AC
  Filled 2024-05-14: qty 2

## 2024-05-14 MED ORDER — LIDOCAINE HCL (CARDIAC) PF 100 MG/5ML IV SOSY
PREFILLED_SYRINGE | INTRAVENOUS | Status: DC | PRN
  Administered 2024-05-14: 20 mg via INTRAVENOUS

## 2024-05-14 MED ORDER — OXYCODONE HCL 5 MG PO TABS: 5.0000 mg | ORAL_TABLET | Freq: Once | ORAL | Status: DC | PRN

## 2024-05-14 MED ORDER — DEXMEDETOMIDINE HCL IN NACL 80 MCG/20ML IV SOLN
INTRAVENOUS | Status: AC
  Filled 2024-05-14: qty 20

## 2024-05-14 MED ORDER — PROPOFOL 500 MG/50ML IV EMUL
INTRAVENOUS | Status: DC | PRN
  Administered 2024-05-14: 40 mg via INTRAVENOUS
  Administered 2024-05-14: 50 ug/kg/min via INTRAVENOUS
  Administered 2024-05-14: 20 mg via INTRAVENOUS

## 2024-05-14 SURGICAL SUPPLY — 32 items
BLADE SURG 15 STRL LF DISP TIS (BLADE) ×2 IMPLANT
BNDG ELASTIC 3INX 5YD STR LF (GAUZE/BANDAGES/DRESSINGS) ×1 IMPLANT
BNDG ESMARK 4X9 LF (GAUZE/BANDAGES/DRESSINGS) IMPLANT
BNDG GAUZE DERMACEA FLUFF 4 (GAUZE/BANDAGES/DRESSINGS) ×1 IMPLANT
CHLORAPREP W/TINT 26 (MISCELLANEOUS) ×1 IMPLANT
CORD BIPOLAR FORCEPS 12FT (ELECTRODE) ×1 IMPLANT
COVER BACK TABLE 60X90IN (DRAPES) ×1 IMPLANT
COVER MAYO STAND STRL (DRAPES) ×1 IMPLANT
CUFF TOURN SGL QUICK 18X4 (TOURNIQUET CUFF) ×1 IMPLANT
DRAPE EXTREMITY T 121X128X90 (DISPOSABLE) ×1 IMPLANT
DRAPE SURG 17X23 STRL (DRAPES) ×1 IMPLANT
GAUZE PAD ABD 8X10 STRL (GAUZE/BANDAGES/DRESSINGS) ×1 IMPLANT
GAUZE SPONGE 4X4 12PLY STRL (GAUZE/BANDAGES/DRESSINGS) ×1 IMPLANT
GAUZE XEROFORM 1X8 LF (GAUZE/BANDAGES/DRESSINGS) ×1 IMPLANT
GLOVE BIO SURGEON STRL SZ7.5 (GLOVE) ×1 IMPLANT
GLOVE BIOGEL PI IND STRL 7.0 (GLOVE) IMPLANT
GLOVE BIOGEL PI IND STRL 7.5 (GLOVE) IMPLANT
GLOVE BIOGEL PI IND STRL 8 (GLOVE) ×1 IMPLANT
GLOVE SURG SS PI 7.0 STRL IVOR (GLOVE) IMPLANT
GOWN STRL REUS W/ TWL LRG LVL3 (GOWN DISPOSABLE) ×1 IMPLANT
GOWN STRL REUS W/TWL XL LVL3 (GOWN DISPOSABLE) ×1 IMPLANT
NDL HYPO 25X1 1.5 SAFETY (NEEDLE) ×1 IMPLANT
NEEDLE HYPO 25X1 1.5 SAFETY (NEEDLE) ×1 IMPLANT
NS IRRIG 1000ML POUR BTL (IV SOLUTION) ×1 IMPLANT
PACK BASIN DAY SURGERY FS (CUSTOM PROCEDURE TRAY) ×1 IMPLANT
PADDING CAST ABS COTTON 4X4 ST (CAST SUPPLIES) ×1 IMPLANT
STOCKINETTE 4X48 STRL (DRAPES) ×1 IMPLANT
SUT ETHILON 4 0 PS 2 18 (SUTURE) ×1 IMPLANT
SYR BULB EAR ULCER 3OZ GRN STR (SYRINGE) ×1 IMPLANT
SYR CONTROL 10ML LL (SYRINGE) ×1 IMPLANT
TOWEL GREEN STERILE FF (TOWEL DISPOSABLE) ×2 IMPLANT
UNDERPAD 30X36 HEAVY ABSORB (UNDERPADS AND DIAPERS) ×1 IMPLANT

## 2024-05-14 NOTE — H&P (Signed)
 Becky Stevens is an 83 y.o. female.   Chief Complaint: carpal tunnel syndrome HPI: 83 y.o. yo female with numbness and tingling right hand.  Nocturnal symptoms. Positive nerve conduction studies. She wishes to have right carpal tunnel release.   Allergies:  Allergies  Allergen Reactions   Lisinopril Anaphylaxis   Azithromycin    Celecoxib    Morphine Nausea And Vomiting   Nitrofurantoin    Quinapril     Past Medical History:  Diagnosis Date   Arthritis    Chronic bilateral low back pain with bilateral sciatica 02/28/2018   Last Assessment & Plan:  Patient understands she may benefit from percutaneous interventions of the lumbar spine.  Patient reports having previous lumbar spine surgery.  Last Assessment & Plan:  Formatting of this note might be different from the original. Patient has undergone multiple percutaneous interventions, most recent left-sided L3 transforaminal in February.   Chronic pain of left knee 06/28/2019   Last Assessment & Plan:  Formatting of this note might be different from the original. Following with Orthopedics, receiving regular intra-articular injections.  Received most recent within the last several weeks.   Chronic pain syndrome    Class 1 obesity due to excess calories without serious comorbidity with body mass index (BMI) of 32.0 to 32.9 in adult 01/12/2019   Last Assessment & Plan:  Patient understands any weight loss she is able to achieve only help benefit her overall health and pain issues.  Last Assessment & Plan:  Formatting of this note might be different from the original. Patient understands any weight loss she is able to achieve will only help benefit her overall health and pain issues.   Constipation    COPD (chronic obstructive pulmonary disease) (HCC)    DDD (degenerative disc disease), cervical 02/28/2018   Last Assessment & Plan:  Patient understands she may benefit from percutaneous interventions of the cervical spine.  Patient will continue with  medications.  Last Assessment & Plan:  Formatting of this note might be different from the original. Patient understands she may benefit from percutaneous interventions of the cervical spine.  We will continue with current medications.   DDD (degenerative disc disease), lumbar 02/28/2018   Last Assessment & Plan:  Formatting of this note might be different from the original. See laminectomy plan   Degenerative disc disease, lumbar    Diabetes mellitus with neuropathy (HCC)    Esophageal reflux    Facet joint disease 02/28/2018   Last Assessment & Plan:  Patient understands she may benefit from percutaneous interventions to include medial branch blocks and possible radiofrequency ablations.  Last Assessment & Plan:  Formatting of this note might be different from the original. Patient understands she may benefit from percutaneous interventions.  Patient has had previous lumbar spine surgeries.   Fibromyalgia    History of blood clots    Hyperlipidemia    Hypertension, essential, benign    Joint pain    Lupus    Muscle spasm of back 02/28/2018   Last Assessment & Plan:  Patient has Baclofen 10 mg that she uses as needed for muscle spasms.  She does not need a refill on this today.  Last Assessment & Plan:  Formatting of this note might be different from the original. Discontinue use of all muscle relaxants due to intolerance   Neck pain 02/28/2018   Last Assessment & Plan:  Patient understands she may benefit from percutaneous interventions of the cervical spine.  Last Assessment & Plan:  Formatting of this note might be different from the original. Patient understands she may benefit from percutaneous interventions the cervical spine.   Neuropathy 02/28/2018   Last Assessment & Plan:  Patient will continue gabapentin 400 mg every 8 hours.  Last Assessment & Plan:  Formatting of this note might be different from the original. Continue gabapentin as prescribed by PCP   Osteoarthritis    Other chronic pain  02/28/2018   Last Assessment & Plan:  Patient understands she may benefit from percutaneous interventions.  We will continue with medications.  There is an opioid agreement on file within the clinic.  Last Assessment & Plan:  Formatting of this note might be different from the original. Patient understands she may benefit from percutaneous interventions.  We will continue with medications.  There is an opioid    Pain due to total right knee replacement (HCC) 09/02/2020   Pain medication agreement 01/12/2019   Last Assessment & Plan:  There is a current opioid agreement on file within the clinic and urine drug screens will be collected as needed to be in compliance with clinic policies.  Last Assessment & Plan:  Formatting of this note might be different from the original. Drug screen completed today.  Review of prior drug screens are all within normal limits.  [place]  controlled substance regist   Rheumatoid arthritis (HCC)    S/P laminectomy with spinal fusion 02/28/2018   Last Assessment & Plan:  Patient reports to having previous lumbar spine surgery.  Patient understands she may benefit from percutaneous interventions of the lumbar spine.  Last Assessment & Plan:  Formatting of this note is different from the original. 83 year old female who has undergone prior L4 through S1 fusion and unfortunately has since developed adjacent segment syndrome.  Review of recent   Shortness of breath    Therapeutic opioid-induced constipation (OIC) 02/28/2018   Last Assessment & Plan:  Formatting of this note might be different from the original. Continue Movantik 25 daily   Thyroid  condition    Urinary tract infection, recurrent    Vitamin D  deficiency     Past Surgical History:  Procedure Laterality Date   ABDOMINAL HYSTERECTOMY     BACK SURGERY     X 3   other     nerves clipped left knee   PARTIAL HYSTERECTOMY     REPLACEMENT TOTAL KNEE Right     Family History: Family History  Problem Relation  Age of Onset   Hypertension Other    Heart disease Other    Diabetes Other    Rheum arthritis Other     Social History:   reports that she has never smoked. She has never used smokeless tobacco. She reports that she does not currently use alcohol . She reports that she does not use drugs.  Medications: Medications Prior to Admission  Medication Sig Dispense Refill   albuterol (VENTOLIN HFA) 108 (90 Base) MCG/ACT inhaler Inhale 2 puffs into the lungs every 6 (six) hours as needed.     aspirin EC 81 MG tablet Take 81 mg by mouth daily. Swallow whole.     atorvastatin (LIPITOR) 80 MG tablet TAKE ONE TABLET BY MOUTH AT BEDTIME 90 tablet 11   DULoxetine (CYMBALTA) 60 MG capsule TAKE ONE CAPSULE BY MOUTH EVERY DAY 90 capsule 11   famotidine (PEPCID) 20 MG tablet TAKE ONE TABLET BY MOUTH TWICE DAILY 180 tablet 11   FEROSUL 325 (65 Fe) MG tablet Take 325 mg by mouth daily.  gabapentin (NEURONTIN) 300 MG capsule Take 300 mg by mouth 2 (two) times daily.     glimepiride (AMARYL) 2 MG tablet TAKE ONE TABLET BY MOUTH EVERY DAY 30 tablet 11   hydrochlorothiazide (HYDRODIURIL) 25 MG tablet TAKE ONE TABLET BY MOUTH EVERY DAY 90 tablet 11   levothyroxine (SYNTHROID) 75 MCG tablet TAKE ONE TABLET BY MOUTH DAILY ON AN EMPTY STOMACH 60 MINUTES BEFORE BREAKFAST 30 tablet 11   lubiprostone  (AMITIZA ) 24 MCG capsule TAKE ONE CAPSULE BY MOUTH TWICE DAILY 180 capsule 11   metoprolol  tartrate (LOPRESSOR ) 50 MG tablet TAKE ONE TABLET BY MOUTH TWICE DAILY 180 tablet 11   montelukast (SINGULAIR) 10 MG tablet TAKE ONE TABLET BY MOUTH EVERY DAY 90 tablet 11   nystatin cream (MYCOSTATIN) APPLY TO THE AFFECTED AREA TO RASH UNDER BREASTS TWICE DAILY FOR 10 DAYS     pantoprazole (PROTONIX) 40 MG tablet TAKE ONE TABLET BY MOUTH EVERY DAY 90 tablet 11   diclofenac Sodium (VOLTAREN) 1 % GEL Apply 1 application topically 4 (four) times daily as needed.     meloxicam (MOBIC) 7.5 MG tablet Take 7.5 mg by mouth daily.      naloxone (NARCAN) nasal spray 4 mg/0.1 mL Administer 1 spray into either nostril once as needed for opiate overdose. May repeat dose in 2-3 minutes if no response from initial dose.     ONETOUCH ULTRA test strip Use one test strip daily 100 strip 11   oxyCODONE (OXY IR/ROXICODONE) 5 MG immediate release tablet Take 10 mg by mouth as needed for pain.     OZEMPIC , 0.25 OR 0.5 MG/DOSE, 2 MG/3ML SOPN Inject 0.25 mg into the skin once a week. 3 mL 3    No results found for this or any previous visit (from the past 48 hours).  No results found.    Height 5' 2 (1.575 m), weight 68 kg.  General appearance: alert, cooperative, and appears stated age Head: Normocephalic, without obvious abnormality, atraumatic Neck: supple, symmetrical, trachea midline Extremities: Intact capillary refill all digits.  +epl/fpl/io.  No wounds.  Skin: Skin color, texture, turgor normal. No rashes or lesions Neurologic: Grossly normal Incision/Wound: none  Assessment/Plan Right carpal tunnel syndrome.  Non operative and operative treatment options have been discussed with the patient and patient wishes to proceed with operative treatment. Risks, benefits, and alternatives of surgery have been discussed and the patient agrees with the plan of care.   Alexina Niccoli 05/14/2024, 1:13 PM

## 2024-05-14 NOTE — Transfer of Care (Signed)
 Immediate Anesthesia Transfer of Care Note  Patient: Becky Stevens  Procedure(s) Performed: RIGHT CARPAL TUNNEL RELEASE (Right: Hand)  Patient Location: PACU  Anesthesia Type:MAC  Level of Consciousness: awake  Airway & Oxygen  Therapy: Patient Spontanous Breathing and Patient connected to face mask oxygen   Post-op Assessment: Report given to RN and Post -op Vital signs reviewed and stable  Post vital signs: Reviewed and stable  Last Vitals:  Vitals Value Taken Time  BP 131/61 05/14/24 16:12  Temp    Pulse 68 05/14/24 16:12  Resp 12 05/14/24 16:12  SpO2 99 % 05/14/24 16:12  Vitals shown include unfiled device data.  Last Pain:  Vitals:   05/14/24 1337  TempSrc: Oral  PainSc: 6       Patients Stated Pain Goal: 6 (05/14/24 1337)  Complications: No notable events documented.

## 2024-05-14 NOTE — Anesthesia Preprocedure Evaluation (Addendum)
 Anesthesia Evaluation  Patient identified by MRN, date of birth, ID band Patient awake    Reviewed: Allergy & Precautions, NPO status , Patient's Chart, lab work & pertinent test results  Airway Mallampati: III  TM Distance: >3 FB Neck ROM: Limited    Dental  (+) Edentulous Upper, Edentulous Lower, Dental Advisory Given   Pulmonary shortness of breath and with exertion, COPD   Pulmonary exam normal breath sounds clear to auscultation       Cardiovascular hypertension, + Valvular Problems/Murmurs  Rhythm:Regular Rate:Normal - Systolic murmurs    Neuro/Psych  Neuromuscular disease    GI/Hepatic Neg liver ROS,GERD  ,,  Endo/Other  diabetes    Renal/GU negative Renal ROS     Musculoskeletal  (+) Arthritis ,  Fibromyalgia -  Abdominal   Peds  Hematology negative hematology ROS (+)   Anesthesia Other Findings   Reproductive/Obstetrics                             Anesthesia Physical Anesthesia Plan  ASA: 3  Anesthesia Plan: Bier Block and Bier Block-Lidocaine  Only   Post-op Pain Management: Tylenol  PO (pre-op)*   Induction: Intravenous  PONV Risk Score and Plan: 2 and Propofol  infusion, Treatment may vary due to age or medical condition and TIVA  Airway Management Planned: Natural Airway  Additional Equipment:   Intra-op Plan:   Post-operative Plan:   Informed Consent: I have reviewed the patients History and Physical, chart, labs and discussed the procedure including the risks, benefits and alternatives for the proposed anesthesia with the patient or authorized representative who has indicated his/her understanding and acceptance.     Dental advisory given  Plan Discussed with: CRNA  Anesthesia Plan Comments: (Pt states she had full cardiac workup prior to her TKA 05/2023. None of these records were requested by [facility]. She does have some DOE, but I could not hear a murmur. She  states that the workup didn't reveal anything dangerous and that she was cleared for her TKA. )        Anesthesia Quick Evaluation

## 2024-05-14 NOTE — Discharge Instructions (Addendum)

## 2024-05-14 NOTE — Op Note (Signed)
 05/14/2024 [facility]                              OPERATIVE REPORT   PREOPERATIVE DIAGNOSIS:  Right carpal tunnel syndrome  POSTOPERATIVE DIAGNOSIS:  Right carpal tunnel syndrome  PROCEDURE:  Right carpal tunnel release  SURGEON:  Franky Curia, MD  ASSISTANT:  none.  ANESTHESIA: Bier block with sedation  IV FLUIDS:  Per anesthesia flow sheet  ESTIMATED BLOOD LOSS:  Minimal  COMPLICATIONS:  None  SPECIMENS:  None  TOURNIQUET TIME:    Total Tourniquet Time Documented: Forearm (Right) - 30 minutes Total: Forearm (Right) - 30 minutes   DISPOSITION:  Stable to PACU  LOCATION: [facility]  INDICATIONS:  83 y.o. yo female with numbness and tingling right hand.  Nocturnal symptoms. Positive nerve conduction studies. She wishes to proceed with right carpal tunnel release.  Risks, benefits and alternatives of surgery were discussed including the risk of blood loss; infection; damage to nerves, vessels, tendons, ligaments, bone; failure of surgery; need for additional surgery; complications with wound healing; continued pain; recurrence of carpal tunnel syndrome; and damage to motor branch. She voiced understanding of these risks and elected to proceed.   OPERATIVE COURSE:  After being identified preoperatively by myself, the patient and I agreed upon the procedure and site of procedure.  The surgical site was marked.  Surgical consent had been signed.  She was given IV Ancef  as preoperative antibiotic prophylaxis.  She was transferred to the operating room and placed on the operating room table in supine position with the Right upper extremity on an armboard.  Bier block anesthesia was induced by the anesthesiologist.  Right upper extremity was prepped and draped in normal sterile orthopaedic fashion.  A surgical pause was performed between the surgeons, anesthesia, and operating room staff, and all were in agreement as to the patient, procedure, and site of  procedure.  Tourniquet at the proximal aspect of the forearm had been inflated for the Bier block  Incision was made over the transverse carpal ligament and carried into the subcutaneous tissues by spreading technique.  Bipolar electrocautery was used to obtain hemostasis.  The palmar fascia was sharply incised.  The transverse carpal ligament was identified.  The fascia distal to the ligament was opened.  Retractor was placed and the flexor tendons were identified.  The flexor tendon to the little finger was identified and retracted radially.  The transverse carpal ligament was then incised from distal to proximal under direct visualization.  Scissors were used to split the distal aspect of the volar antebrachial fascia.  A finger was placed into the wound to ensure complete decompression, which was the case.  The nerve was examined.  There was an hourglass deformity.  The motor branch was identified and was intact.  The wound was copiously irrigated with sterile saline.  It was then closed with 4-0 nylon in a horizontal mattress fashion.  It was injected with 0.25% plain Marcaine  to aid in postoperative analgesia.  It was dressed with sterile Xeroform, 4x4s, an ABD, and wrapped with Kerlix and an Ace bandage.  Tourniquet was deflated at 30 minutes.  Fingertips were pink with brisk capillary refill after deflation of the tourniquet.  Operative drapes were broken down.  The patient was awoken from anesthesia safely.  She was transferred back to stretcher and taken to the PACU in stable condition.  I will see her back  in the office in 1 week for postoperative followup.  She states she has pain medication at home already for post operative pain control.    Sharnae Winfree, MD Electronically signed, 05/14/24

## 2024-05-15 ENCOUNTER — Encounter (HOSPITAL_BASED_OUTPATIENT_CLINIC_OR_DEPARTMENT_OTHER): Payer: Self-pay | Admitting: Orthopedic Surgery

## 2024-05-15 NOTE — Anesthesia Postprocedure Evaluation (Signed)
 Anesthesia Post Note  Patient: Becky Stevens  Procedure(s) Performed: RIGHT CARPAL TUNNEL RELEASE (Right: Hand)     Patient location during evaluation: PACU Anesthesia Type: Bier Block Level of consciousness: awake and alert Pain management: pain level controlled Vital Signs Assessment: post-procedure vital signs reviewed and stable Respiratory status: spontaneous breathing Cardiovascular status: stable Anesthetic complications: no   No notable events documented.  Last Vitals:  Vitals:   05/14/24 1630 05/14/24 1640  BP: 133/66 (!) 141/60  Pulse: 64 67  Resp: 19 16  Temp:  (!) 36.2 C  SpO2: 93% 93%    Last Pain:  Vitals:   05/14/24 1640  TempSrc:   PainSc: 0-No pain                 Norleen Pope

## 2024-05-22 DIAGNOSIS — G5601 Carpal tunnel syndrome, right upper limb: Secondary | ICD-10-CM | POA: Diagnosis not present

## 2024-05-28 DIAGNOSIS — G5603 Carpal tunnel syndrome, bilateral upper limbs: Secondary | ICD-10-CM | POA: Diagnosis not present

## 2024-05-31 DIAGNOSIS — K219 Gastro-esophageal reflux disease without esophagitis: Secondary | ICD-10-CM | POA: Diagnosis not present

## 2024-05-31 DIAGNOSIS — Z79899 Other long term (current) drug therapy: Secondary | ICD-10-CM | POA: Diagnosis not present

## 2024-05-31 DIAGNOSIS — I10 Essential (primary) hypertension: Secondary | ICD-10-CM | POA: Diagnosis not present

## 2024-05-31 DIAGNOSIS — E559 Vitamin D deficiency, unspecified: Secondary | ICD-10-CM | POA: Diagnosis not present

## 2024-05-31 DIAGNOSIS — E782 Mixed hyperlipidemia: Secondary | ICD-10-CM | POA: Diagnosis not present

## 2024-05-31 DIAGNOSIS — D649 Anemia, unspecified: Secondary | ICD-10-CM | POA: Diagnosis not present

## 2024-05-31 DIAGNOSIS — E118 Type 2 diabetes mellitus with unspecified complications: Secondary | ICD-10-CM | POA: Diagnosis not present

## 2024-05-31 DIAGNOSIS — G894 Chronic pain syndrome: Secondary | ICD-10-CM | POA: Diagnosis not present

## 2024-05-31 DIAGNOSIS — Z13228 Encounter for screening for other metabolic disorders: Secondary | ICD-10-CM | POA: Diagnosis not present

## 2024-07-02 DIAGNOSIS — G5603 Carpal tunnel syndrome, bilateral upper limbs: Secondary | ICD-10-CM | POA: Diagnosis not present

## 2024-07-03 DIAGNOSIS — E039 Hypothyroidism, unspecified: Secondary | ICD-10-CM | POA: Diagnosis not present

## 2024-07-03 DIAGNOSIS — I10 Essential (primary) hypertension: Secondary | ICD-10-CM | POA: Diagnosis not present

## 2024-07-03 DIAGNOSIS — Z79899 Other long term (current) drug therapy: Secondary | ICD-10-CM | POA: Diagnosis not present

## 2024-07-03 DIAGNOSIS — R82998 Other abnormal findings in urine: Secondary | ICD-10-CM | POA: Diagnosis not present

## 2024-07-03 DIAGNOSIS — E118 Type 2 diabetes mellitus with unspecified complications: Secondary | ICD-10-CM | POA: Diagnosis not present

## 2024-07-03 DIAGNOSIS — K219 Gastro-esophageal reflux disease without esophagitis: Secondary | ICD-10-CM | POA: Diagnosis not present

## 2024-08-02 DIAGNOSIS — M48062 Spinal stenosis, lumbar region with neurogenic claudication: Secondary | ICD-10-CM | POA: Diagnosis not present

## 2024-08-02 DIAGNOSIS — M170 Bilateral primary osteoarthritis of knee: Secondary | ICD-10-CM | POA: Diagnosis not present

## 2024-08-02 DIAGNOSIS — M542 Cervicalgia: Secondary | ICD-10-CM | POA: Diagnosis not present

## 2024-08-02 DIAGNOSIS — E1140 Type 2 diabetes mellitus with diabetic neuropathy, unspecified: Secondary | ICD-10-CM | POA: Diagnosis not present

## 2024-08-02 DIAGNOSIS — M797 Fibromyalgia: Secondary | ICD-10-CM | POA: Diagnosis not present

## 2024-08-02 DIAGNOSIS — M47816 Spondylosis without myelopathy or radiculopathy, lumbar region: Secondary | ICD-10-CM | POA: Diagnosis not present

## 2024-08-02 DIAGNOSIS — K219 Gastro-esophageal reflux disease without esophagitis: Secondary | ICD-10-CM | POA: Diagnosis not present

## 2024-08-02 DIAGNOSIS — M51369 Other intervertebral disc degeneration, lumbar region without mention of lumbar back pain or lower extremity pain: Secondary | ICD-10-CM | POA: Diagnosis not present

## 2024-08-02 DIAGNOSIS — J449 Chronic obstructive pulmonary disease, unspecified: Secondary | ICD-10-CM | POA: Diagnosis not present

## 2024-08-02 DIAGNOSIS — G629 Polyneuropathy, unspecified: Secondary | ICD-10-CM | POA: Diagnosis not present

## 2024-08-02 DIAGNOSIS — R011 Cardiac murmur, unspecified: Secondary | ICD-10-CM | POA: Diagnosis not present

## 2024-08-02 DIAGNOSIS — K5900 Constipation, unspecified: Secondary | ICD-10-CM | POA: Diagnosis not present

## 2024-08-08 DIAGNOSIS — Z79891 Long term (current) use of opiate analgesic: Secondary | ICD-10-CM | POA: Diagnosis not present

## 2024-09-26 ENCOUNTER — Other Ambulatory Visit: Payer: Self-pay | Admitting: Internal Medicine

## 2024-09-26 DIAGNOSIS — E088 Diabetes mellitus due to underlying condition with unspecified complications: Secondary | ICD-10-CM

## 2024-09-27 ENCOUNTER — Other Ambulatory Visit: Payer: Self-pay | Admitting: Internal Medicine

## 2024-10-12 ENCOUNTER — Other Ambulatory Visit: Payer: Self-pay | Admitting: Internal Medicine

## 2024-11-09 ENCOUNTER — Other Ambulatory Visit: Payer: Self-pay | Admitting: Internal Medicine

## 2024-11-09 DIAGNOSIS — I10 Essential (primary) hypertension: Secondary | ICD-10-CM

## 2024-12-10 ENCOUNTER — Other Ambulatory Visit: Payer: Self-pay | Admitting: Internal Medicine

## 2024-12-10 DIAGNOSIS — I10 Essential (primary) hypertension: Secondary | ICD-10-CM
